# Patient Record
Sex: Male | Born: 1980 | Race: White | Hispanic: No | Smoking: Never smoker
Health system: Southern US, Community
[De-identification: ages and names within clinical notes are randomized; demographics above are authoritative.]

## PROBLEM LIST (undated history)

## (undated) DIAGNOSIS — N200 Calculus of kidney: Secondary | ICD-10-CM

## (undated) DIAGNOSIS — F329 Major depressive disorder, single episode, unspecified: Secondary | ICD-10-CM

## (undated) DIAGNOSIS — F419 Anxiety disorder, unspecified: Secondary | ICD-10-CM

## (undated) DIAGNOSIS — F32A Depression, unspecified: Secondary | ICD-10-CM

## (undated) DIAGNOSIS — K589 Irritable bowel syndrome without diarrhea: Secondary | ICD-10-CM

## (undated) DIAGNOSIS — K649 Unspecified hemorrhoids: Secondary | ICD-10-CM

## (undated) DIAGNOSIS — J189 Pneumonia, unspecified organism: Secondary | ICD-10-CM

## (undated) DIAGNOSIS — G43909 Migraine, unspecified, not intractable, without status migrainosus: Secondary | ICD-10-CM

## (undated) HISTORY — DX: Major depressive disorder, single episode, unspecified: F32.9

## (undated) HISTORY — DX: Unspecified hemorrhoids: K64.9

## (undated) HISTORY — DX: Migraine, unspecified, not intractable, without status migrainosus: G43.909

## (undated) HISTORY — DX: Calculus of kidney: N20.0

## (undated) HISTORY — DX: Anxiety disorder, unspecified: F41.9

## (undated) HISTORY — DX: Depression, unspecified: F32.A

## (undated) HISTORY — DX: Irritable bowel syndrome, unspecified: K58.9

---
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
## 2007-03-05 DIAGNOSIS — F411 Generalized anxiety disorder: Secondary | ICD-10-CM

## 2007-03-05 DIAGNOSIS — R51 Headache: Secondary | ICD-10-CM

## 2007-03-05 DIAGNOSIS — R519 Headache, unspecified: Secondary | ICD-10-CM

## 2007-03-05 DIAGNOSIS — J309 Allergic rhinitis, unspecified: Secondary | ICD-10-CM

## 2007-03-05 HISTORY — DX: Allergic rhinitis, unspecified: J30.9

## 2007-03-05 HISTORY — DX: Generalized anxiety disorder: F41.1

## 2007-03-05 HISTORY — DX: Headache, unspecified: R51.9

## 2012-03-28 HISTORY — PX: ESOPHAGOGASTRODUODENOSCOPY: SHX1529

## 2012-03-28 HISTORY — PX: COLONOSCOPY: SHX174

## 2014-01-02 ENCOUNTER — Encounter: Payer: Self-pay | Admitting: Family Medicine

## 2014-01-02 ENCOUNTER — Ambulatory Visit (INDEPENDENT_AMBULATORY_CARE_PROVIDER_SITE_OTHER): Payer: 59 | Admitting: Family Medicine

## 2014-01-02 VITALS — BP 94/65 | HR 84 | Temp 99.0°F | Resp 18 | Ht 71.5 in | Wt 233.0 lb

## 2014-01-02 DIAGNOSIS — F4323 Adjustment disorder with mixed anxiety and depressed mood: Secondary | ICD-10-CM | POA: Insufficient documentation

## 2014-01-02 HISTORY — DX: Adjustment disorder with mixed anxiety and depressed mood: F43.23

## 2014-01-02 MED ORDER — DULOXETINE HCL 60 MG PO CPEP: 60.0000 mg | ORAL_CAPSULE | Freq: Every day | ORAL | Status: DC

## 2014-01-02 MED ORDER — TRAZODONE HCL 100 MG PO TABS: 100.0000 mg | ORAL_TABLET | Freq: Every day | ORAL | Status: DC

## 2014-01-02 NOTE — Progress Notes (Signed)
Office Note 01/02/2014  CC:  Chief Complaint  Patient presents with  . Establish Care    HPI:  Keith Walton is a 33 y.o. White male who is here to establish care.  Patient's most recent primary MD: Dr. Kandice Moosupe at [facility] in [place]---unfortunately she died in MVA and practice closed.  Dr. Jeneen RinksPav at [facility] in [place] was recent psychiatric provider. Old records were not reviewed prior to or during today's visit.  Pt hospitalized in [place], July 6th and 7th, exhaustion and dehydration secondary to being overworked. He was dx'd with adjustment d/o with anxiety and depression features, has hx of panic attacks as well. Was put on cymbalta 30mg  qd and trazodone 100 mg qhs in this hospitalization.  Trial of zoloft prior to this caused bad HAs and "tremulous on the inside", so this med was stopped. Seeing Dr. Lavone OrnAnn Harrell, Licensed counselor, 1 X per week sessions--in [place].  Complains of difficulty concentrating and delayed ejaculation since getting on cymbalta.  Anxiety still pretty high but seems like depression stable--no SI or HI.  He is looking for a job.   Trazedone qhs helping well with sleep.  Past Medical History  Diagnosis Date  . Anxiety   . Migraine   . IBS (irritable bowel syndrome)     alt constip/diarrhea [facility])  . Kidney stone   . Depression     Past Surgical History  Procedure Laterality Date  . Colonoscopy  2014  . Esophagogastroduodenoscopy  2014    Family History  Problem Relation Age of Onset  . Thyroid disease Sister   . Depression Paternal Grandmother   . Schizophrenia Paternal Grandmother     History   Social History  . Marital Status: Significant Other    Spouse Name: N/A    Number of Children: N/A  . Years of Education: N/A   Occupational History  . Not on file.   Social History Main Topics  . Smoking status: Never Smoker   . Smokeless tobacco: Never Used  . Alcohol Use: 1.2 oz/week    1 Glasses  of wine, 1 Cans of beer per week     Comment: twice weekly  . Drug Use: No  . Sexual Activity: Not on file   Other Topics Concern  . Not on file   Social History Narrative   Single, has domestic partner.   No children.   Masters degree in Nutritional therapistmedia design from [employer].   Currently looking for job as of 12/2013.   No tob, 1-2 alcoholic drinks per week, no hx of prob with alc or drugs.    Outpatient Encounter Prescriptions as of 01/02/2014  Medication Sig  . Multiple Vitamin (MULTIVITAMIN) tablet Take 1 tablet by mouth daily.  . traZODone (DESYREL) 100 MG tablet Take 1 tablet (100 mg total) by mouth at bedtime.  . [DISCONTINUED] DULoxetine (CYMBALTA) 30 MG capsule Take 30 mg by mouth daily.  . [DISCONTINUED] traZODone (DESYREL) 100 MG tablet Take 100 mg by mouth at bedtime.  . DULoxetine (CYMBALTA) 60 MG capsule Take 1 capsule (60 mg total) by mouth daily.    Allergies  Allergen Reactions  . Clarithromycin   . Erythromycin   . Ibuprofen   . Levofloxacin   . Penicillins     ROS Review of Systems  Constitutional: Negative for fever, chills, appetite change and fatigue.  HENT: Negative for congestion, dental problem, ear pain and sore throat.   Eyes: Negative for discharge, redness and visual disturbance.  Respiratory: Negative for cough, chest tightness, shortness of breath and wheezing.   Cardiovascular: Negative for chest pain, palpitations and leg swelling.  Gastrointestinal: Negative for nausea, vomiting, abdominal pain, diarrhea and blood in stool.  Genitourinary: Negative for dysuria, urgency, frequency, hematuria, flank pain and difficulty urinating.  Musculoskeletal: Negative for arthralgias, back pain, joint swelling, myalgias and neck stiffness.  Skin: Negative for pallor and rash.  Neurological: Negative for dizziness, speech difficulty, weakness and headaches.  Hematological: Negative for adenopathy. Does not bruise/bleed easily.  Psychiatric/Behavioral: Positive for  dysphoric mood and decreased concentration. Negative for hallucinations, behavioral problems, confusion, sleep disturbance and agitation. The patient is nervous/anxious. The patient is not hyperactive.     PE; Blood pressure 94/65, pulse 84, temperature 99 F (37.2 C), temperature source Temporal, resp. rate 18, height 5' 11.5" (1.816 m), weight 233 lb (105.688 kg), SpO2 98.00%. Gen: Alert, well appearing.  Patient is oriented to person, place, time, and situation. AFFECT: pleasant, lucid thought and speech. ZOX:WRUE: no injection, icteris, swelling, or exudate.  EOMI, PERRLA. Mouth: lips without lesion/swelling.  Oral mucosa pink and moist. Oropharynx without erythema, exudate, or swelling.  Neck - No masses or thyromegaly or limitation in range of motion CV: RRR, no m/r/g.   LUNGS: CTA bilat, nonlabored resps, good aeration in all lung fields. EXT: no clubbing, cyanosis, or edema.  Neuro: CN 2-12 intact bilaterally, strength 5/5 in proximal and distal upper extremities and lower extremities bilaterally.  No sensory deficits.  No tremor.  No disdiadochokinesis.  No ataxia.  Upper extremity and lower extremity DTRs symmetric.  No pronator drift.  Pertinent labs:  none  ASSESSMENT AND PLAN:   New pt: obtain old recores.  Adjustment disorder with mixed anxiety and depressed mood Increase cymbalta to 60mg  qd. Hopefully concentration will improve with more therapeutic dosing.  Continue trazodone 100mg  qhs as this is helping nicely. Continue weekly visits with counselor/therapist.  Spent 30 min with pt today, with >50% of this time spent in counseling and care coordination regarding the above problems.  An After Visit Summary was printed and given to the patient.  Return in about 4 weeks (around 01/30/2014) for f/u dep/anx.

## 2014-01-02 NOTE — Assessment & Plan Note (Signed)
Increase cymbalta to 60mg  qd. Hopefully concentration will improve with more therapeutic dosing.  Continue trazodone 100mg  qhs as this is helping nicely. Continue weekly visits with counselor/therapist.

## 2014-01-02 NOTE — Progress Notes (Signed)
Pre visit review using our clinic review tool, if applicable. No additional management support is needed unless otherwise documented below in the visit note. 

## 2014-01-09 ENCOUNTER — Telehealth: Payer: Self-pay

## 2014-01-09 NOTE — Telephone Encounter (Signed)
Pt called and scheduled appointment with Dr Milinda CaveMcGowen. He would like to know if he could have a letter or Rx for massage therapy. He states his psychologist Lavone OrnAnn Harrell at [facility] recommended this for his anxiety/depression. Is this something you can do? Please advise.

## 2014-01-12 ENCOUNTER — Telehealth: Payer: Self-pay

## 2014-01-12 NOTE — Telephone Encounter (Signed)
Rx written. I'll bring it to your lab.

## 2014-01-12 NOTE — Telephone Encounter (Signed)
Spoke with pt, advised Rx ready to be picked up.

## 2014-01-12 NOTE — Telephone Encounter (Signed)
Spoke with pt, he states since he has been on Cymbalta and Trazadone it makes him sweat profusely to where he has to change clothes from sweating so bad. He states his heart rate goes up also. He wants to know if this is a side effect from the medication? Will it go away, what should he do? He has only been on Cymbalta for 2 weeks. Please advise.

## 2014-01-14 NOTE — Telephone Encounter (Signed)
May stop cymbalta and trazodone. We'll regroup/discuss a new med plan at upcoming f/u visit.-thx

## 2014-01-14 NOTE — Telephone Encounter (Signed)
Called pt, left detailed message on voicemail. 

## 2014-01-20 ENCOUNTER — Ambulatory Visit: Payer: 59 | Admitting: Family Medicine

## 2014-01-22 ENCOUNTER — Ambulatory Visit (INDEPENDENT_AMBULATORY_CARE_PROVIDER_SITE_OTHER): Payer: 59 | Admitting: Family Medicine

## 2014-01-22 ENCOUNTER — Encounter: Payer: Self-pay | Admitting: Family Medicine

## 2014-01-22 VITALS — BP 120/77 | HR 97 | Temp 98.0°F | Resp 18 | Ht 71.5 in | Wt 232.0 lb

## 2014-01-22 DIAGNOSIS — R5381 Other malaise: Secondary | ICD-10-CM

## 2014-01-22 DIAGNOSIS — F4323 Adjustment disorder with mixed anxiety and depressed mood: Secondary | ICD-10-CM

## 2014-01-22 DIAGNOSIS — J309 Allergic rhinitis, unspecified: Secondary | ICD-10-CM

## 2014-01-22 DIAGNOSIS — R5383 Other fatigue: Secondary | ICD-10-CM

## 2014-01-22 DIAGNOSIS — R002 Palpitations: Secondary | ICD-10-CM

## 2014-01-22 DIAGNOSIS — R61 Generalized hyperhidrosis: Secondary | ICD-10-CM

## 2014-01-22 DIAGNOSIS — J069 Acute upper respiratory infection, unspecified: Secondary | ICD-10-CM

## 2014-01-22 LAB — CBC WITH DIFFERENTIAL/PLATELET
BASOS ABS: 0.1 10*3/uL (ref 0.0–0.1)
BASOS PCT: 0.6 % (ref 0.0–3.0)
EOS ABS: 0.1 10*3/uL (ref 0.0–0.7)
Eosinophils Relative: 1.6 % (ref 0.0–5.0)
HCT: 46 % (ref 39.0–52.0)
HEMOGLOBIN: 15.1 g/dL (ref 13.0–17.0)
Lymphocytes Relative: 31.9 % (ref 12.0–46.0)
Lymphs Abs: 2.9 10*3/uL (ref 0.7–4.0)
MCHC: 32.9 g/dL (ref 30.0–36.0)
MCV: 92.1 fl (ref 78.0–100.0)
MONO ABS: 0.9 10*3/uL (ref 0.1–1.0)
Monocytes Relative: 9.4 % (ref 3.0–12.0)
NEUTROS ABS: 5.1 10*3/uL (ref 1.4–7.7)
NEUTROS PCT: 56.5 % (ref 43.0–77.0)
Platelets: 376 10*3/uL (ref 150.0–400.0)
RBC: 4.99 Mil/uL (ref 4.22–5.81)
RDW: 14.3 % (ref 11.5–15.5)
WBC: 9.1 10*3/uL (ref 4.0–10.5)

## 2014-01-22 LAB — COMPREHENSIVE METABOLIC PANEL
ALT: 39 U/L (ref 0–53)
AST: 28 U/L (ref 0–37)
Albumin: 3.9 g/dL (ref 3.5–5.2)
Alkaline Phosphatase: 86 U/L (ref 39–117)
BILIRUBIN TOTAL: 0.5 mg/dL (ref 0.2–1.2)
BUN: 12 mg/dL (ref 6–23)
CO2: 26 mEq/L (ref 19–32)
Calcium: 9.8 mg/dL (ref 8.4–10.5)
Chloride: 103 mEq/L (ref 96–112)
Creatinine, Ser: 0.8 mg/dL (ref 0.4–1.5)
GFR: 122.55 mL/min (ref >60.00)
GLUCOSE: 92 mg/dL (ref 70–99)
Potassium: 4.1 mEq/L (ref 3.5–5.1)
Sodium: 137 mEq/L (ref 135–145)
Total Protein: 7.3 g/dL (ref 6.0–8.3)

## 2014-01-22 LAB — TSH: TSH: 0.67 u[IU]/mL (ref 0.35–4.50)

## 2014-01-22 MED ORDER — DULOXETINE HCL 30 MG PO CPEP: ORAL_CAPSULE | ORAL | Status: DC

## 2014-01-22 MED ORDER — PAROXETINE HCL 10 MG PO TABS: ORAL_TABLET | ORAL | Status: DC

## 2014-01-22 MED ORDER — MOMETASONE FUROATE 50 MCG/ACT NA SUSP: 2.0000 | Freq: Every day | NASAL | Status: DC

## 2014-01-22 NOTE — Progress Notes (Signed)
OFFICE VISIT  01/22/2014   CC:  Chief Complaint  Patient presents with  . Follow-up    medication problem, sweats   HPI:    Patient is a 33 y.o. Caucasian male who presents for f/u anxiety/depression. He is hopeful about a new job: 3rd interview today.   Cymbalta helped anxiety a lot, but he gets profuse sweats and feels lightheaded 3-4 times a day. Sweats were continuous and he felt w/drawal anxiety when he tried stopping cymbalta cold [place].  Scratchy throat last 3-4 d, describes PND, + sneezing.  No meds taken.  No eye sx's.  No cough or wheezing. Denies feeling heartburn/reflux.  Flonase in the past didn't help much.  Claritin D makes him have heart palpitations.  Past Medical History  Diagnosis Date  . Anxiety   . Migraine   . IBS (irritable bowel syndrome)     alt constip/diarrhea [facility]  . Kidney stone   . Depression     Past Surgical History  Procedure Laterality Date  . Colonoscopy  2014  . Esophagogastroduodenoscopy  2014    Outpatient Prescriptions Prior to Visit  Medication Sig Dispense Refill  . DULoxetine (CYMBALTA) 60 MG capsule Take 1 capsule (60 mg total) by mouth daily.  30 capsule  1  . Multiple Vitamin (MULTIVITAMIN) tablet Take 1 tablet by mouth daily.      . traZODone (DESYREL) 100 MG tablet Take 1 tablet (100 mg total) by mouth at bedtime.  30 tablet  1   No facility-administered medications prior to visit.    Allergies  Allergen Reactions  . Clarithromycin   . Erythromycin   . Ibuprofen   . Levofloxacin   . Penicillins     ROS As per HPI  PE: Blood pressure 120/77, pulse 97, temperature 98 F (36.7 C), temperature source Oral, resp. rate 18, height 5' 11.5" (1.816 m), weight 232 lb (105.235 kg), SpO2 98.00%. VS: noted--normal. Gen: alert, NAD, WELL-APPEARING. HEENT: eyes without injection, drainage, or swelling.  Ears: EACs clear, TMs with normal light reflex and landmarks.  Nose: Clear rhinorrhea, with some  dried, crusty exudate adherent to mildly injected mucosa.  No purulent d/c.  Righ maxillary and frontal paranasal sinus TTP.  No facial swelling.  Throat and mouth without focal lesion.  Tonsils are large but non-erythematous and w/out exudate.  They are symmetric.  Uvula and post pharynx have no erythema, swelling, or exudate.   Neck: supple, no LAD.   LUNGS: CTA bilat, nonlabored resps.   CV: RRR, no m/r/g. EXT: no c/c/e SKIN: no rash  LABS:  None today  IMPRESSION AND PLAN:  1) Depression and anxiety: question of profuse sweating and increased HR on this med.  Ween off of cymbalta and start paxil:  cymbalta 30mg  qd x 7d, then 30mg  qod x 7 doses. Paxil 10mg  qd x 7d, then increase to 2 of the 10mg  tabs qd.  May continue trazodone 100 mg qhs for sleep. Check TSH, CBC, CMET.  2) Viral URI vs allergic rhinitis.  Infectious sinusitis doubtful. Recommended saline nasal spray, rx'd nasonex, recommended he try a daily OTC nonsedating antihistamine.  An After Visit Summary was printed and given to the patient.   FOLLOW UP:  3-4 wks

## 2014-01-22 NOTE — Patient Instructions (Signed)
Try an OTC antihistamine once a day such as generic claritin, zyrtec, or allegra. Use OTC saline nasal spray to irrigate nose 2 times per day.

## 2014-01-22 NOTE — Progress Notes (Signed)
Pre visit review using our clinic review tool, if applicable. No additional management support is needed unless otherwise documented below in the visit note. 

## 2014-01-26 DIAGNOSIS — J069 Acute upper respiratory infection, unspecified: Secondary | ICD-10-CM | POA: Insufficient documentation

## 2014-01-27 ENCOUNTER — Encounter: Payer: Self-pay | Admitting: Family Medicine

## 2014-02-02 ENCOUNTER — Telehealth: Payer: Self-pay | Admitting: Family Medicine

## 2014-02-02 MED ORDER — CITALOPRAM HYDROBROMIDE 20 MG PO TABS: 20.0000 mg | ORAL_TABLET | Freq: Every day | ORAL | Status: DC

## 2014-02-02 NOTE — Telephone Encounter (Signed)
OK. D/C paxil. Continue to ween off of cymbalta. I'll send eRx for citalopram for pt to start: follow instructions on bottle.-thx

## 2014-02-02 NOTE — Telephone Encounter (Signed)
Patient LMOM stating that paxil is giving him horrible body aches, he states they are so bad it's hard for him to walk around and do normal daily duties.  Please advise.

## 2014-02-02 NOTE — Telephone Encounter (Signed)
Left message for pt to call back  °

## 2014-02-03 NOTE — Telephone Encounter (Signed)
Spoke with pt, advised message from Dr. Milinda CaveMcGowen. Pt understood. Pt states he doesn't want to try another serotonin uptake inhibitor. He just wants to stay on Trazadone. He is scared of the effects from these medications. I advised pt to keep Dr Milinda CaveMcGowen updated about this process. Pt agreed.

## 2014-02-03 NOTE — Telephone Encounter (Signed)
Noted  

## 2014-02-13 ENCOUNTER — Encounter: Payer: Self-pay | Admitting: Family Medicine

## 2014-02-13 ENCOUNTER — Ambulatory Visit (INDEPENDENT_AMBULATORY_CARE_PROVIDER_SITE_OTHER): Payer: 59 | Admitting: Family Medicine

## 2014-02-13 VITALS — BP 111/76 | HR 86 | Temp 99.0°F | Resp 18 | Ht 71.5 in | Wt 226.0 lb

## 2014-02-13 DIAGNOSIS — K589 Irritable bowel syndrome without diarrhea: Secondary | ICD-10-CM

## 2014-02-13 DIAGNOSIS — F411 Generalized anxiety disorder: Secondary | ICD-10-CM

## 2014-02-13 DIAGNOSIS — R634 Abnormal weight loss: Secondary | ICD-10-CM

## 2014-02-13 NOTE — Progress Notes (Signed)
OFFICE NOTE  02/13/2014  CC:  Chief Complaint  Patient presents with  . Follow-up   HPI: Patient is a 33 y.o. Caucasian male who is here for 3 wk f/u anxiety.  Last visit I started him on paxil but this gave him body aches so he d/c'd it.  He chose not to take the citalopram I suggested after this.  He is afraid of the effects of these meds now, wanted to stick with just trazodone.  Sleeping fine on trazodone. Having much less anxiety and depression than when I initially started seeing him.  Says he has an afternoon dip most days but no extreme or prolonged sx's.  Still seeing counselor and he has found her suggestion of adult coloring book use as a good therapy for his anxiety.  Says he weighed 265 lbs in June this year, now weighs 226---40 lb drop in 2-3 months, NOT purposeful. Struggles with IBS--alternates between loose BMs after meals frequently and then periods of constipation.  Denies BRBPR or melena.    Pertinent PMH:  Past medical, surgical, social, and family history reviewed and no changes are noted since last office visit.  MEDS: Not on celexa listed below Outpatient Prescriptions Prior to Visit  Medication Sig Dispense Refill  . Multiple Vitamin (MULTIVITAMIN) tablet Take 1 tablet by mouth daily.      . traZODone (DESYREL) 100 MG tablet Take 1 tablet (100 mg total) by mouth at bedtime.  30 tablet  1  . citalopram (CELEXA) 20 MG tablet Take 1 tablet (20 mg total) by mouth daily.  30 tablet  1  . mometasone (NASONEX) 50 MCG/ACT nasal spray Place 2 sprays into the nose daily.  17 g  12   No facility-administered medications prior to visit.    PE: Blood pressure 111/76, pulse 86, temperature 99 F (37.2 C), temperature source Temporal, resp. rate 18, height 5' 11.5" (1.816 m), weight 226 lb (102.513 kg), SpO2 99.00%. Gen: Alert, well appearing.  Patient is oriented to person, place, time, and situation. Oropharynx: symmetrically large tonsils--chronic.  No erythema or  exudate. Neck - No masses or thyromegaly or limitation in range of motion CV: RRR, no m/r/g.   LUNGS: CTA bilat, nonlabored resps, good aeration in all lung fields. ABD: soft/NT/ND EXT: no clubbing, cyanosis, or edema.  SKIN: no jaundice or rash  LAB: none today Lab Results  Component Value Date   WBC 9.1 01/22/2014   HGB 15.1 01/22/2014   HCT 46.0 01/22/2014   MCV 92.1 01/22/2014   PLT 376.0 01/22/2014     Chemistry      Component Value Date/Time   NA 137 01/22/2014 0936   K 4.1 01/22/2014 0936   CL 103 01/22/2014 0936   CO2 26 01/22/2014 0936   BUN 12 01/22/2014 0936   CREATININE 0.8 01/22/2014 0936      Component Value Date/Time   CALCIUM 9.8 01/22/2014 0936   ALKPHOS 86 01/22/2014 0936   AST 28 01/22/2014 0936   ALT 39 01/22/2014 0936   BILITOT 0.5 01/22/2014 0936     Lab Results  Component Value Date   TSH 0.67 01/22/2014      IMPRESSION AND PLAN:  1) Anxiety/depression/insomnia:  Holding with only trazodone hs and counseling for now.  Will consider trial of another antidepressant in future if he worsens, but will do an SNRI instead of SSRI since he has not tolerated 3 of these.  2) Unintentional wt loss: recent basic labs normal. Exam unremarkable.  PO  intake pretty much normal/unchanged.   Will check hemoccults today and have him get established with a local GI MD for his IBS and possibly further eval for his wt loss (?malabsorptioin.  However, as in [facility] section, he has had SB bx normal in the past).Marland Kitchen  An After Visit Summary was printed and given to the patient. He declined flu vaccine today.  FOLLOW UP: 1 mo for f/u wt loss.

## 2014-02-13 NOTE — Progress Notes (Signed)
Pre visit review using our clinic review tool, if applicable. No additional management support is needed unless otherwise documented below in the visit note. 

## 2014-02-16 ENCOUNTER — Telehealth: Payer: Self-pay | Admitting: Family Medicine

## 2014-02-16 MED ORDER — TRAZODONE HCL 100 MG PO TABS
100.0000 mg | ORAL_TABLET | Freq: Every day | ORAL | Status: DC
Start: 2014-02-16 — End: 2014-05-13

## 2014-02-16 NOTE — Telephone Encounter (Signed)
Please advise if we can even refill this medication before it's due.

## 2014-02-16 NOTE — Telephone Encounter (Signed)
Yes, ok to rf this med early.-thx

## 2014-02-16 NOTE — Telephone Encounter (Signed)
Sent in new E-RX. Left detailed message on pt's phone of early refill sent into pharmacy.  Okay per DPR.

## 2014-02-16 NOTE — Telephone Encounter (Signed)
Patient knocked his bottle in to the toilet last night. Please send in Rx today, patient does not have medication to take tonight.

## 2014-03-16 ENCOUNTER — Encounter: Payer: Self-pay | Admitting: Family Medicine

## 2014-03-17 ENCOUNTER — Ambulatory Visit: Payer: 59 | Admitting: Family Medicine

## 2014-03-23 ENCOUNTER — Ambulatory Visit (INDEPENDENT_AMBULATORY_CARE_PROVIDER_SITE_OTHER): Payer: 59 | Admitting: Family Medicine

## 2014-03-23 ENCOUNTER — Encounter: Payer: Self-pay | Admitting: Family Medicine

## 2014-03-23 VITALS — BP 107/75 | HR 84 | Temp 98.4°F | Resp 18 | Ht 71.5 in | Wt 224.0 lb

## 2014-03-23 DIAGNOSIS — R634 Abnormal weight loss: Secondary | ICD-10-CM | POA: Insufficient documentation

## 2014-03-23 DIAGNOSIS — F4323 Adjustment disorder with mixed anxiety and depressed mood: Secondary | ICD-10-CM

## 2014-03-23 NOTE — Patient Instructions (Signed)
CPT code for Therapeutic massage is [phone]

## 2014-03-23 NOTE — Progress Notes (Signed)
Pre visit review using our clinic review tool, if applicable. No additional management support is needed unless otherwise documented below in the visit note. 

## 2014-03-23 NOTE — Progress Notes (Signed)
OFFICE NOTE  03/23/2014  CC:  Chief Complaint  Patient presents with  . Follow-up   HPI: Patient is a 33 y.o. Caucasian male who is here for 5 week f/u anxiety/depression and unintentional weight loss. Anx/dep a bit better, [place]. Does NOT desire any further psych meds.  He feels like he is coping pretty well with things right now.  Trazodone helping hs for sleep.  Massage therapy helped A LOT: he needs CPT code for insurance to cover. His appetite is fine, energy level fine, no diarrhea or abd pain.  He doesn't want to pursue any further blood testing for his problem of unintentional wt loss b/c he thinks since it has slowed down quite a bit over the last 2 mo (9 lb down in last 2.5 mo compared to 40 lbs down in the prior 2-3 mo) it is ok to watch and wait and is likely related to his "nervous breakdown".  Says GI has not contacted him about referral that I ordered last visit.   Pertinent PMH:  Past medical, surgical, social, and family history reviewed and no changes are noted since last office visit.  MEDS:  Trazodone 100 mg qhs for sleep, MVI qd  PE: Blood pressure 107/75, pulse 84, temperature 98.4 F (36.9 C), temperature source Temporal, resp. rate 18, height 5' 11.5" (1.816 m), weight 224 lb (101.606 kg), SpO2 97.00%. Gen: Alert, well appearing.  Patient is oriented to person, place, time, and situation. No further exam today.  IMPRESSION AND PLAN:  1) Adjustment d/o with anxiety and depression features: improving. Intolerant of several antidepressant trials.  Continue trazodone hs for sleep, massage therapy for stress reduction.  2) Unintentional Wt loss; watchful waiting approach.  No sign of organic dz on minimal w/u with blood work and CPE.  Past GI eval has shown normal colonoscopy and EGD.   GI has been trying to contact him, has left 3 messages on his VM. We'll make appt while he's here today.  An After Visit Summary was printed  and given to the patient.  FOLLOW UP: 4 mo

## 2014-04-02 ENCOUNTER — Encounter: Payer: Self-pay | Admitting: Family Medicine

## 2014-04-02 ENCOUNTER — Encounter: Payer: Self-pay | Admitting: Internal Medicine

## 2014-05-12 ENCOUNTER — Other Ambulatory Visit: Payer: Self-pay | Admitting: Family Medicine

## 2014-05-12 NOTE — Telephone Encounter (Signed)
RF request for trazadone.  Last OV was 10.5.15.   Last RX was 8.31.15 x 1 rf.  Please advise.

## 2014-05-13 MED ORDER — TRAZODONE HCL 100 MG PO TABS: 100.0000 mg | ORAL_TABLET | Freq: Every day | ORAL | Status: DC

## 2014-05-13 NOTE — Telephone Encounter (Signed)
Okay to fill per Dr. Milinda CaveMcGowen.  Rx sent as previously prescribed x 1 rf.

## 2014-05-13 NOTE — Telephone Encounter (Signed)
Agree 

## 2014-06-04 ENCOUNTER — Ambulatory Visit: Payer: 59 | Admitting: Internal Medicine

## 2014-07-27 ENCOUNTER — Ambulatory Visit: Payer: 59 | Admitting: Family Medicine

## 2014-09-02 DIAGNOSIS — K582 Mixed irritable bowel syndrome: Secondary | ICD-10-CM | POA: Insufficient documentation

## 2014-11-25 ENCOUNTER — Ambulatory Visit: Payer: Self-pay | Admitting: Family Medicine

## 2014-11-26 ENCOUNTER — Ambulatory Visit: Payer: Self-pay | Admitting: Family Medicine

## 2015-12-09 ENCOUNTER — Encounter (HOSPITAL_BASED_OUTPATIENT_CLINIC_OR_DEPARTMENT_OTHER): Payer: Self-pay

## 2015-12-09 ENCOUNTER — Emergency Department (HOSPITAL_BASED_OUTPATIENT_CLINIC_OR_DEPARTMENT_OTHER)
Admission: EM | Admit: 2015-12-09 | Discharge: 2015-12-09 | Disposition: A | Discharge status: Home / Self Care | Payer: BLUE CROSS/BLUE SHIELD | Attending: Emergency Medicine | Admitting: Emergency Medicine

## 2015-12-09 ENCOUNTER — Emergency Department (HOSPITAL_BASED_OUTPATIENT_CLINIC_OR_DEPARTMENT_OTHER): Payer: BLUE CROSS/BLUE SHIELD

## 2015-12-09 DIAGNOSIS — F329 Major depressive disorder, single episode, unspecified: Secondary | ICD-10-CM | POA: Insufficient documentation

## 2015-12-09 DIAGNOSIS — N200 Calculus of kidney: Secondary | ICD-10-CM | POA: Diagnosis not present

## 2015-12-09 DIAGNOSIS — R109 Unspecified abdominal pain: Secondary | ICD-10-CM | POA: Diagnosis present

## 2015-12-09 LAB — URINALYSIS, ROUTINE W REFLEX MICROSCOPIC
BILIRUBIN URINE: NEGATIVE
GLUCOSE, UA: NEGATIVE mg/dL
KETONES UR: 15 mg/dL — AB
LEUKOCYTES UA: NEGATIVE
Nitrite: NEGATIVE
PROTEIN: NEGATIVE mg/dL
Specific Gravity, Urine: 1.02 (ref 1.005–1.030)
pH: 5.5 (ref 5.0–8.0)

## 2015-12-09 LAB — BASIC METABOLIC PANEL
Anion gap: 9 (ref 5–15)
BUN: 21 mg/dL — ABNORMAL HIGH (ref 6–20)
CALCIUM: 9.2 mg/dL (ref 8.9–10.3)
CO2: 24 mmol/L (ref 22–32)
CREATININE: 1.18 mg/dL (ref 0.61–1.24)
Chloride: 104 mmol/L (ref 101–111)
GFR calc non Af Amer: >60 mL/min (ref >60)
GLUCOSE: 116 mg/dL — AB (ref 65–99)
Potassium: 4 mmol/L (ref 3.5–5.1)
Sodium: 137 mmol/L (ref 135–145)

## 2015-12-09 LAB — URINE MICROSCOPIC-ADD ON: Bacteria, UA: NONE SEEN

## 2015-12-09 MED ORDER — KETOROLAC TROMETHAMINE 30 MG/ML IJ SOLN
30.0000 mg | Freq: Once | INTRAMUSCULAR | Status: AC
  Administered 2015-12-09: 30 mg via INTRAVENOUS
  Filled 2015-12-09: qty 1

## 2015-12-09 MED ORDER — TAMSULOSIN HCL 0.4 MG PO CAPS
0.4000 mg | ORAL_CAPSULE | Freq: Every day | ORAL | Status: DC
  Administered 2015-12-09: 0.4 mg via ORAL
  Filled 2015-12-09: qty 1

## 2015-12-09 MED ORDER — ONDANSETRON 8 MG PO TBDP: ORAL_TABLET | ORAL | Status: DC

## 2015-12-09 MED ORDER — OXYCODONE-ACETAMINOPHEN 5-325 MG PO TABS: 1.0000 | ORAL_TABLET | Freq: Four times a day (QID) | ORAL | Status: DC | PRN

## 2015-12-09 MED ORDER — MAGNESIUM SULFATE 50 % IJ SOLN
INTRAMUSCULAR | Status: AC
  Administered 2015-12-09: 04:00:00
  Filled 2015-12-09: qty 2

## 2015-12-09 MED ORDER — SODIUM CHLORIDE 0.9 % IV BOLUS (SEPSIS)
500.0000 mL | Freq: Once | INTRAVENOUS | Status: AC
  Administered 2015-12-09: 500 mL via INTRAVENOUS

## 2015-12-09 MED ORDER — DICLOFENAC SODIUM ER 100 MG PO TB24: 100.0000 mg | ORAL_TABLET | Freq: Every day | ORAL | Status: DC

## 2015-12-09 MED ORDER — MAGNESIUM SULFATE IN D5W 1-5 GM/100ML-% IV SOLN
1.0000 g | Freq: Once | INTRAVENOUS | Status: DC
  Filled 2015-12-09: qty 100

## 2015-12-09 MED ORDER — ONDANSETRON HCL 4 MG/2ML IJ SOLN
4.0000 mg | Freq: Once | INTRAMUSCULAR | Status: AC
  Administered 2015-12-09: 4 mg via INTRAVENOUS
  Filled 2015-12-09: qty 2

## 2015-12-09 MED ORDER — MORPHINE SULFATE (PF) 4 MG/ML IV SOLN
4.0000 mg | Freq: Once | INTRAVENOUS | Status: AC
  Administered 2015-12-09: 4 mg via INTRAVENOUS
  Filled 2015-12-09: qty 1

## 2015-12-09 MED ORDER — TAMSULOSIN HCL 0.4 MG PO CAPS: 0.4000 mg | ORAL_CAPSULE | Freq: Every day | ORAL | Status: DC

## 2015-12-09 NOTE — Discharge Instructions (Signed)
Dietary Guidelines to Help Prevent Kidney Stones Your risk of kidney stones can be decreased by adjusting the foods you eat. The most important thing you can do is drink enough fluid. You should drink enough fluid to keep your urine clear or pale yellow. The following guidelines provide specific information for the type of kidney stone you have had. GUIDELINES ACCORDING TO TYPE OF KIDNEY STONE Calcium Oxalate Kidney Stones  Reduce the amount of salt you eat. Foods that have a lot of salt cause your body to release excess calcium into your urine. The excess calcium can combine with a substance called oxalate to form kidney stones.  Reduce the amount of animal protein you eat if the amount you eat is excessive. Animal protein causes your body to release excess calcium into your urine. Ask your dietitian how much protein from animal sources you should be eating.  Avoid foods that are high in oxalates. If you take vitamins, they should have less than 500 mg of vitamin C. Your body turns vitamin C into oxalates. You do not need to avoid fruits and vegetables high in vitamin C. Calcium Phosphate Kidney Stones  Reduce the amount of salt you eat to help prevent the release of excess calcium into your urine.  Reduce the amount of animal protein you eat if the amount you eat is excessive. Animal protein causes your body to release excess calcium into your urine. Ask your dietitian how much protein from animal sources you should be eating.  Get enough calcium from food or take a calcium supplement (ask your dietitian for recommendations). Food sources of calcium that do not increase your risk of kidney stones include:  Broccoli.  Dairy products, such as cheese and yogurt.  Pudding. Uric Acid Kidney Stones  Do not have more than 6 oz of animal protein per day. FOOD SOURCES Animal Protein Sources  Meat (all types).  Poultry.  Eggs.  Fish, seafood. Foods High in Salt  Salt seasonings.  Soy  sauce.  Teriyaki sauce.  Cured and processed meats.  Salted crackers and snack foods.  Fast food.  Canned soups and most canned foods. Foods High in Oxalates  Grains:  Amaranth.  Barley.  Grits.  Wheat germ.  Bran.  Buckwheat flour.  All bran cereals.  Pretzels.  Whole wheat bread.  Vegetables:  Beans (wax).  Beets and beet greens.  Collard greens.  Eggplant.  Escarole.  Leeks.  Okra.  Parsley.  Rutabagas.  Spinach.  Swiss chard.  Tomato paste.  Fried potatoes.  Sweet potatoes.  Fruits:  Red currants.  Figs.  Kiwi.  Rhubarb.  Meat and Other Protein Sources:  Beans (dried).  Soy burgers and other soybean products.  Miso.  Nuts (peanuts, almonds, pecans, cashews, hazelnuts).  Nut butters.  Sesame seeds and tahini (paste made of sesame seeds).  Poppy seeds.  Beverages:  Chocolate drink mixes.  Soy milk.  Instant iced tea.  Juices made from high-oxalate fruits or vegetables.  Other:  Carob.  Chocolate.  Fruitcake.  Marmalades.   This information is not intended to replace advice given to you by your health care provider. Make sure you discuss any questions you have with your health care provider.   Document Released: 09/30/2010 Document Revised: 06/10/2013 Document Reviewed: 05/02/2013 Elsevier Interactive Patient Education 2016 [employer]  

## 2015-12-09 NOTE — ED Provider Notes (Signed)
CSN: [id number]     Arrival date & time 12/09/15  0201 History   First MD Initiated Contact with Patient 12/09/15 0206     Chief Complaint  Patient presents with  . Flank Pain     (Consider location/radiation/quality/duration/timing/severity/associated sxs/prior Treatment) Patient is a 35 y.o. male presenting with flank pain. The history is provided by the patient.  Flank Pain This is a recurrent problem. The current episode started 6 to 12 hours ago. The problem occurs constantly. The problem has not changed since onset.Pertinent negatives include no chest pain, no abdominal pain, no headaches and no shortness of breath. Nothing aggravates the symptoms. He has tried ASA for the symptoms. The treatment provided no relief.    Past Medical History  Diagnosis Date  . Anxiety and depression   . Migraine syndrome   . IBS (irritable bowel syndrome)     alt constip/diarrhea [facility])  . Kidney stone   . Hemorrhoids     int and ext: hx of BRBPR   Past Surgical History  Procedure Laterality Date  . Colonoscopy  03/28/2012    "Small int hemorrhoids".  Otherwise normal.  Repeat at age 24 was recommended.  . Esophagogastroduodenoscopy  03/28/2012    Duodenal bx: normal small bowel mucosa.  Gastric bx: minimal superficial chronic inflammation--H pylori neg.   Family History  Problem Relation Age of Onset  . Thyroid disease Sister   . Depression Paternal Grandmother   . Schizophrenia Paternal Grandmother    Social History  Substance Use Topics  . Smoking status: Never Smoker   . Smokeless tobacco: Never Used  . Alcohol Use: 1.2 oz/week    1 Glasses of wine, 1 Cans of beer per week     Comment: twice weekly    Review of Systems  Respiratory: Negative for shortness of breath.   Cardiovascular: Negative for chest pain.  Gastrointestinal: Negative for abdominal pain.  Genitourinary: Positive for flank pain. Negative for dysuria, frequency and hematuria.   Neurological: Negative for headaches.  All other systems reviewed and are negative.     Allergies  Clarithromycin; Erythromycin; Ibuprofen; Levofloxacin; and Penicillins  Home Medications   Prior to Admission medications   Medication Sig Start Date End Date Taking? Authorizing Provider  citalopram (CELEXA) 20 MG tablet Take 1 tablet (20 mg total) by mouth daily. 02/02/14   Jeoffrey Massed, MD  Diclofenac Sodium CR (VOLTAREN-XR) 100 MG 24 hr tablet Take 1 tablet (100 mg total) by mouth daily. 12/09/15   Steed Kanaan, MD  mometasone (NASONEX) 50 MCG/ACT nasal spray Place 2 sprays into the nose daily. 01/22/14   Jeoffrey Massed, MD  Multiple Vitamin (MULTIVITAMIN) tablet Take 1 tablet by mouth daily.    Historical Provider, MD  ondansetron (ZOFRAN ODT) 8 MG disintegrating tablet  ODT q8 hours prn nausea 12/09/15   Nghia Mcentee, MD  oxyCODONE-acetaminophen (PERCOCET) 5-325 MG tablet Take 1 tablet by mouth every 6 (six) hours as needed. 12/09/15   Ericah Scotto, MD  tamsulosin (FLOMAX) 0.4 MG CAPS capsule Take 1 capsule (0.4 mg total) by mouth daily. 12/09/15   Jamarion Jumonville, MD  traZODone (DESYREL) 100 MG tablet Take 1 tablet (100 mg total) by mouth at bedtime. 05/13/14   Jeoffrey Massed, MD   BP 116/74 mmHg  Pulse 84  Temp(Src) 98.8 F (37.1 C) (Oral)  Resp 20  Ht  (1.88 m)  Wt 245 lb (111.131 kg)  BMI 31.44 kg/m2  SpO2 97% Physical Exam  Constitutional: He is oriented to person, place, and time. He appears well-developed and well-nourished. No distress.  HENT:  Head: Normocephalic and atraumatic.  Mouth/Throat: Oropharynx is clear and moist.  Eyes: Conjunctivae are normal. Pupils are equal, round, and reactive to light.  Neck: Normal range of motion. Neck supple.  Cardiovascular: Normal rate, regular rhythm and intact distal pulses.   Pulmonary/Chest: Effort normal and breath sounds normal. No respiratory distress. He has no wheezes. He has no rales.  Abdominal: Soft.  Bowel sounds are normal. There is no tenderness. There is no rebound and no guarding.  Musculoskeletal: Normal range of motion.  Neurological: He is alert and oriented to person, place, and time.  Skin: Skin is warm and dry.  Psychiatric: He has a normal mood and affect.    ED Course  Procedures (including critical care time) Labs Review Labs Reviewed  URINALYSIS, ROUTINE W REFLEX MICROSCOPIC (NOT AT [facility]) - Abnormal; Notable for the following:    Hgb urine dipstick SMALL (*)    Ketones, ur 15 (*)    All other components within normal limits  BASIC METABOLIC PANEL - Abnormal; Notable for the following:    Glucose, Bld 116 (*)    BUN 21 (*)    All other components within normal limits  URINE MICROSCOPIC-ADD ON - Abnormal; Notable for the following:    Squamous Epithelial / LPF 0-5 (*)    All other components within normal limits    Imaging Review Ct Renal Stone Study  12/09/2015  CLINICAL DATA:  Acute onset of right flank pain and hematuria. Initial encounter. EXAM: CT ABDOMEN AND PELVIS WITHOUT CONTRAST TECHNIQUE: Multidetector CT imaging of the abdomen and pelvis was performed following the standard protocol without IV contrast. COMPARISON:  None. FINDINGS: The visualized lung bases are clear. The liver and spleen are unremarkable in appearance. The gallbladder is within normal limits. The pancreas and adrenal glands are unremarkable. Mild-to-moderate right-sided hydronephrosis is noted, with a large obstructing 7 x 6 mm stone at the proximal right ureter, 4-5 cm below the right renal pelvis. Mild right-sided perinephric stranding is noted. Scattered nonobstructing left renal stones are seen, measuring up to 4 mm in size. No free fluid is identified. The small bowel is unremarkable in appearance. The stomach is within normal limits. No acute vascular abnormalities are seen. The appendix is grossly unremarkable in appearance, though incompletely assessed. There is no evidence for  appendicitis. The colon is decompressed and unremarkable in appearance. The bladder is decompressed and not well assessed. The prostate remains normal in size, with minimal calcification. No inguinal lymphadenopathy is seen. No acute osseous abnormalities are identified. IMPRESSION: 1. Mild-to-moderate right-sided hydronephrosis, with a large obstructing 7 x 6 mm stone at the proximal right ureter, 4-5 cm below the right renal pelvis. 2. Scattered nonobstructing left renal stones, measuring up to 4 mm in size. Electronically Signed   By: Roanna RaiderJeffery  Chang M.D.   On: 12/09/2015 03:34   I have personally reviewed and evaluated these images and lab results as part of my medical decision-making.   EKG Interpretation None      MDM   Final diagnoses:  Kidney stone    Filed Vitals:   12/09/15 0300 12/09/15 0400  BP: 128/78 116/74  Pulse: 64 84  Temp:  98.8 F (37.1 C)  Resp:      Medications  magnesium sulfate IVPB 1 g 100 mL (not administered)  tamsulosin (FLOMAX) capsule 0.4 mg (0.4 mg Oral Given 12/09/15 0354)  ketorolac (  TORADOL) 30 MG/ML injection 30 mg (30 mg Intravenous Given 12/09/15 0253)  sodium chloride 0.9 % bolus 500 mL (0 mLs Intravenous Stopped 12/09/15 0355)  ondansetron (ZOFRAN) injection 4 mg (4 mg Intravenous Given 12/09/15 0252)  morphine 4 MG/ML injection 4 mg (4 mg Intravenous Given 12/09/15 0353)  magnesium sulfate 50 % (IV Push/IM) injection (  Given 12/09/15 0354)    Markedly improved post medication.  Follow up with urology within 7 days.  Strain all urine.  Strict return precautions given      Adel Neyer, MD 12/09/15 0500

## 2015-12-09 NOTE — ED Notes (Signed)
Pt c/o sudden onset of R lower flank pain, with associated nausea. Pt states pain became worse after urinating. Pt states, "it feels like previous kidney stone."

## 2016-06-04 ENCOUNTER — Encounter (HOSPITAL_BASED_OUTPATIENT_CLINIC_OR_DEPARTMENT_OTHER): Payer: Self-pay | Admitting: Emergency Medicine

## 2016-06-04 ENCOUNTER — Emergency Department (HOSPITAL_BASED_OUTPATIENT_CLINIC_OR_DEPARTMENT_OTHER)
Admission: EM | Admit: 2016-06-04 | Discharge: 2016-06-05 | Disposition: A | Discharge status: Home / Self Care | Payer: BLUE CROSS/BLUE SHIELD | Attending: Emergency Medicine | Admitting: Emergency Medicine

## 2016-06-04 ENCOUNTER — Emergency Department (HOSPITAL_BASED_OUTPATIENT_CLINIC_OR_DEPARTMENT_OTHER): Payer: BLUE CROSS/BLUE SHIELD

## 2016-06-04 DIAGNOSIS — N23 Unspecified renal colic: Secondary | ICD-10-CM | POA: Diagnosis not present

## 2016-06-04 DIAGNOSIS — R109 Unspecified abdominal pain: Secondary | ICD-10-CM | POA: Diagnosis present

## 2016-06-04 LAB — URINALYSIS, ROUTINE W REFLEX MICROSCOPIC
BILIRUBIN URINE: NEGATIVE
GLUCOSE, UA: NEGATIVE mg/dL
Ketones, ur: 15 mg/dL — AB
Leukocytes, UA: NEGATIVE
Nitrite: NEGATIVE
PROTEIN: NEGATIVE mg/dL
Specific Gravity, Urine: 1.027 (ref 1.005–1.030)
pH: 5.5 (ref 5.0–8.0)

## 2016-06-04 LAB — BASIC METABOLIC PANEL
Anion gap: 9 (ref 5–15)
BUN: 18 mg/dL (ref 6–20)
CHLORIDE: 105 mmol/L (ref 101–111)
CO2: 25 mmol/L (ref 22–32)
CREATININE: 0.95 mg/dL (ref 0.61–1.24)
Calcium: 9.2 mg/dL (ref 8.9–10.3)
GFR calc Af Amer: >60 mL/min (ref >60)
GFR calc non Af Amer: >60 mL/min (ref >60)
GLUCOSE: 119 mg/dL — AB (ref 65–99)
Potassium: 4 mmol/L (ref 3.5–5.1)
SODIUM: 139 mmol/L (ref 135–145)

## 2016-06-04 LAB — CBC WITH DIFFERENTIAL/PLATELET
Basophils Absolute: 0.1 10*3/uL (ref 0.0–0.1)
Basophils Relative: 0 %
EOS ABS: 0 10*3/uL (ref 0.0–0.7)
EOS PCT: 0 %
HCT: 46.3 % (ref 39.0–52.0)
Hemoglobin: 15.5 g/dL (ref 13.0–17.0)
LYMPHS ABS: 2.3 10*3/uL (ref 0.7–4.0)
Lymphocytes Relative: 13 %
MCH: 30.6 pg (ref 26.0–34.0)
MCHC: 33.5 g/dL (ref 30.0–36.0)
MCV: 91.3 fL (ref 78.0–100.0)
MONO ABS: 1.1 10*3/uL — AB (ref 0.1–1.0)
MONOS PCT: 7 %
Neutro Abs: 13.5 10*3/uL — ABNORMAL HIGH (ref 1.7–7.7)
Neutrophils Relative %: 80 %
PLATELETS: 422 10*3/uL — AB (ref 150–400)
RBC: 5.07 MIL/uL (ref 4.22–5.81)
RDW: 13.6 % (ref 11.5–15.5)
WBC: 17 10*3/uL — AB (ref 4.0–10.5)

## 2016-06-04 LAB — URINALYSIS, MICROSCOPIC (REFLEX)

## 2016-06-04 MED ORDER — MORPHINE SULFATE (PF) 4 MG/ML IV SOLN
4.0000 mg | Freq: Once | INTRAVENOUS | Status: AC
  Administered 2016-06-04: 4 mg via INTRAVENOUS
  Filled 2016-06-04: qty 1

## 2016-06-04 MED ORDER — ONDANSETRON HCL 4 MG/2ML IJ SOLN
4.0000 mg | Freq: Once | INTRAMUSCULAR | Status: AC
  Administered 2016-06-04: 4 mg via INTRAVENOUS
  Filled 2016-06-04: qty 2

## 2016-06-04 MED ORDER — SODIUM CHLORIDE 0.9 % IV BOLUS (SEPSIS)
1000.0000 mL | Freq: Once | INTRAVENOUS | Status: AC
  Administered 2016-06-04: 1000 mL via INTRAVENOUS

## 2016-06-04 MED ORDER — HYDROMORPHONE HCL 1 MG/ML IJ SOLN
1.0000 mg | Freq: Once | INTRAMUSCULAR | Status: AC
  Administered 2016-06-04: 1 mg via INTRAVENOUS
  Filled 2016-06-04: qty 1

## 2016-06-04 MED ORDER — ONDANSETRON 8 MG PO TBDP: ORAL_TABLET | ORAL | 0 refills | Status: DC

## 2016-06-04 MED ORDER — OXYCODONE-ACETAMINOPHEN 5-325 MG PO TABS: 1.0000 | ORAL_TABLET | Freq: Four times a day (QID) | ORAL | 0 refills | Status: DC | PRN

## 2016-06-04 MED ORDER — SODIUM CHLORIDE 0.9 % IV BOLUS (SEPSIS)
1000.0000 mL | Freq: Once | INTRAVENOUS | Status: AC
Start: 2016-06-04 — End: 2016-06-04
  Administered 2016-06-04: 1000 mL via INTRAVENOUS

## 2016-06-04 MED ORDER — TAMSULOSIN HCL 0.4 MG PO CAPS: 0.4000 mg | ORAL_CAPSULE | Freq: Every day | ORAL | 0 refills | Status: DC

## 2016-06-04 NOTE — ED Provider Notes (Signed)
[facility] Provider Note   CSN: [id number] Arrival date & time: 06/04/16  2008  By signing my name below, I, Arianna Nassar and Talbert Nanaul Grant, attest that this documentation has been prepared under the direction and in the presence of Jacalyn LefevreJulie Katiejo Gilroy, MD.  Electronically Signed: Octavia HeirArianna Nassar, ED Scribe. 06/04/16. 8:30 PM.   History   Chief Complaint Chief Complaint  Patient presents with  . Flank Pain    HPI HPI Comments: Sherrlyn HockMatthew D Agcaoili is a 35 y.o. male who presents to the Emergency Department complaining of acute onset, constant flank pain since 1730 this evening. Pt has a hx of kidney stones and he reports that this pain feels just like in the past. Pt reports that he normally has passed the stones. He says that he has taken Percocet-Acetametaphin mix without relief. He has no other complaints.  The history is provided by the patient. No language interpreter was used.    Past Medical History:  Diagnosis Date  . Anxiety and depression   . Hemorrhoids    int and ext: hx of BRBPR  . IBS (irritable bowel syndrome)    alt constip/diarrhea [facility]  . Kidney stone   . Migraine syndrome     Patient Active Problem List   Diagnosis Date Noted  . Unintentional weight loss 03/23/2014  . Adjustment disorder with mixed anxiety and depressed mood 01/02/2014  . ANXIETY 03/05/2007  . ALLERGIC RHINITIS 03/05/2007  . HEADACHE 03/05/2007    Past Surgical History:  Procedure Laterality Date  . COLONOSCOPY  03/28/2012   "Small int hemorrhoids".  Otherwise normal.  Repeat at age 35 was recommended.  . ESOPHAGOGASTRODUODENOSCOPY  03/28/2012   Duodenal bx: normal small bowel mucosa.  Gastric bx: minimal superficial chronic inflammation--H pylori neg.       Home Medications    Prior to Admission medications   Medication Sig Start Date End Date Taking? Authorizing Provider  citalopram (CELEXA) 20 MG tablet Take 1 tablet (20 mg total) by mouth daily.  02/02/14   Jeoffrey MassedPhilip H McGowen, MD  Diclofenac Sodium CR (VOLTAREN-XR) 100 MG 24 hr tablet Take 1 tablet (100 mg total) by mouth daily. 12/09/15   April Palumbo, MD  mometasone (NASONEX) 50 MCG/ACT nasal spray Place 2 sprays into the nose daily. 01/22/14   Jeoffrey MassedPhilip H McGowen, MD  Multiple Vitamin (MULTIVITAMIN) tablet Take 1 tablet by mouth daily.    Historical Provider, MD  ondansetron (ZOFRAN ODT) 8 MG disintegrating tablet 8mg  ODT q8 hours prn nausea 06/04/16   Jacalyn LefevreJulie Ronnell Makarewicz, MD  oxyCODONE-acetaminophen (PERCOCET) 5-325 MG tablet Take 1 tablet by mouth every 6 (six) hours as needed. 06/04/16   Jacalyn LefevreJulie Lonita Debes, MD  tamsulosin (FLOMAX) 0.4 MG CAPS capsule Take 1 capsule (0.4 mg total) by mouth daily. 06/04/16   Jacalyn LefevreJulie Kaytee Taliercio, MD  traZODone (DESYREL) 100 MG tablet Take 1 tablet (100 mg total) by mouth at bedtime. 05/13/14   Jeoffrey MassedPhilip H McGowen, MD    Family History Family History  Problem Relation Age of Onset  . Thyroid disease Sister   . Depression Paternal Grandmother   . Schizophrenia Paternal Grandmother     Social History Social History  Substance Use Topics  . Smoking status: Never Smoker  . Smokeless tobacco: Never Used  . Alcohol use 1.2 oz/week    1 Glasses of wine, 1 Cans of beer per week     Comment: twice weekly     Allergies   Clarithromycin; Erythromycin; Ibuprofen; Levofloxacin; and Penicillins  Review of Systems Review of Systems   A complete 10 system review of systems was obtained and all systems are negative except as noted in the HPI and PMH.    Physical Exam Updated Vital Signs BP 128/83   Pulse 81   Temp 98.2 F (36.8 C) (Oral)   Resp 18   Ht 6\' 2"  (1.88 m)   Wt 255 lb (115.7 kg)   SpO2 96%   BMI 32.74 kg/m   Physical Exam  Constitutional: He is oriented to person, place, and time. He appears well-developed and well-nourished.  Discomfort  HENT:  Head: Normocephalic and atraumatic.  Eyes: EOM are normal.  Neck: Normal range of motion.    Cardiovascular: Normal rate, regular rhythm, normal heart sounds and intact distal pulses.   Pulmonary/Chest: Effort normal and breath sounds normal. No respiratory distress.  Abdominal: Soft. He exhibits no distension. There is no tenderness.  Musculoskeletal: Normal range of motion.  Neurological: He is alert and oriented to person, place, and time.  Skin: Skin is warm and dry.  Psychiatric: He has a normal mood and affect. Judgment normal.  Nursing note and vitals reviewed.    ED Treatments / Results   DIAGNOSTIC STUDIES: Oxygen Saturation is 100% on room air, normal by my interpretation.  COORDINATION OF CARE: 8:28 PM Discussed treatment plan with pt at bedside and pt agreed to plan.   Labs (all labs ordered are listed, but only abnormal results are displayed) Labs Reviewed  BASIC METABOLIC PANEL - Abnormal; Notable for the following:       Result Value   Glucose, Bld 119 (*)    All other components within normal limits  CBC WITH DIFFERENTIAL/PLATELET - Abnormal; Notable for the following:    WBC 17.0 (*)    Platelets 422 (*)    Neutro Abs 13.5 (*)    Monocytes Absolute 1.1 (*)    All other components within normal limits  URINALYSIS, ROUTINE W REFLEX MICROSCOPIC - Abnormal; Notable for the following:    Hgb urine dipstick LARGE (*)    Ketones, ur 15 (*)    All other components within normal limits  URINALYSIS, MICROSCOPIC (REFLEX) - Abnormal; Notable for the following:    Bacteria, UA FEW (*)    Squamous Epithelial / LPF 0-5 (*)    All other components within normal limits    EKG  EKG Interpretation None       Radiology Ct Renal Stone Study  Result Date: 06/04/2016 CLINICAL DATA:  35 year old male with right flank pain. EXAM: CT ABDOMEN AND PELVIS WITHOUT CONTRAST TECHNIQUE: Multidetector CT imaging of the abdomen and pelvis was performed following the standard protocol without IV contrast. COMPARISON:  Abdominal CT dated 12/09/2015 FINDINGS: Evaluation of  this exam is limited in the absence of intravenous contrast. Lower chest: Minimal bibasilar dependent atelectatic changes of the lungs. The visualized lung bases are otherwise clear. No intra-abdominal free air or free fluid. Hepatobiliary: There is diffuse fatty infiltration of the liver. No intrahepatic biliary ductal dilatation. The gallbladder is unremarkable. Pancreas: Unremarkable. No pancreatic ductal dilatation or surrounding inflammatory changes. Spleen: Normal in size without focal abnormality. Adrenals/Urinary Tract: The adrenal glands appear unremarkable. There is a 5 mm distal right ureteral stone with mild right hydronephrosis. Multiple nonobstructing left renal calculi measuring up to 5 mm in the inferior pole of the left kidney. There is no hydronephrosis on the left. The left ureter and urinary bladder appear unremarkable. Stomach/Bowel: Stomach is within normal limits. Appendix appears  normal. No evidence of bowel wall thickening, distention, or inflammatory changes. Vascular/Lymphatic: The abdominal aorta and IVC are grossly unremarkable on this noncontrast study. No portal venous gas identified. There is no adenopathy. Reproductive: The prostate and seminal vesicles are grossly unremarkable. Other: Small fat containing umbilical hernia.  No fluid collection Musculoskeletal: No acute or significant osseous findings. IMPRESSION: A 5 mm distal right ureteral stone with mild right hydronephrosis. Nonobstructing left renal calculi measure up to 5 mm. No hydronephrosis on the left. Electronically Signed   By: Elgie CollardArash  Radparvar M.D.   On: 06/04/2016 23:29    Procedures Procedures (including critical care time)  Medications Ordered in ED Medications  HYDROmorphone (DILAUDID) injection 1 mg (not administered)  ondansetron (ZOFRAN) injection 4 mg (not administered)  sodium chloride 0.9 % bolus 1,000 mL (0 mLs Intravenous Stopped 06/04/16 2304)  morphine 4 MG/ML injection 4 mg (4 mg Intravenous  Given 06/04/16 2040)  ondansetron (ZOFRAN) injection 4 mg (4 mg Intravenous Given 06/04/16 2040)  HYDROmorphone (DILAUDID) injection 1 mg (1 mg Intravenous Given 06/04/16 2143)  sodium chloride 0.9 % bolus 1,000 mL (0 mLs Intravenous Stopped 06/04/16 2211)     Initial Impression / Assessment and Plan / ED Course  I have reviewed the triage vital signs and the nursing notes.  Pertinent labs & imaging results that were available during my care of the patient were reviewed by me and considered in my medical decision making (see chart for details).  Clinical Course     Pain has resolved.  He is instructed to f/u with urology.  He knows to return if worse.  Final Clinical Impressions(s) / ED Diagnoses   Final diagnoses:  Ureteral colic   I personally performed the services described in this documentation, which was scribed in my presence. The recorded information has been reviewed and is accurate.   New Prescriptions Current Discharge Medication List       Jacalyn LefevreJulie Ardell Aaronson, MD 06/04/16 2340

## 2016-06-04 NOTE — ED Notes (Signed)
Pt to CT

## 2016-06-04 NOTE — ED Notes (Signed)
Patient asked for a urine specimine he reports that he is unable at this time

## 2016-06-04 NOTE — ED Triage Notes (Signed)
Patient reports that he has N/V and right sided flank pain - history of kidney stones

## 2016-06-04 NOTE — ED Notes (Signed)
Back from CT

## 2016-06-04 NOTE — ED Notes (Signed)
Missed IV  attempts x 2 to Right AC and RFA, tol well.

## 2016-06-04 NOTE — ED Notes (Signed)
H/o kidney stones, lives in [place]/ [place], [place] [facility] [place], c/o R flank pain c/w kidney stone, last CT 1 yr ago, also reports nv, and intermitant migraine HAs (denies: HA at this time, fever, diarrhea, dizziness, change in stream, hematuria).

## 2017-06-25 DIAGNOSIS — F411 Generalized anxiety disorder: Secondary | ICD-10-CM | POA: Insufficient documentation

## 2017-06-25 HISTORY — DX: Generalized anxiety disorder: F41.1

## 2017-07-20 IMAGING — CT CT RENAL STONE PROTOCOL
2 of 4 series · 16 of 46 positions shown, 18 images · non-contrast
Comparison: Abdominal CT dated 12/09/2015

CLINICAL DATA: 35-year-old male with right flank pain.

EXAM:
CT ABDOMEN AND PELVIS WITHOUT CONTRAST
TECHNIQUE: Multidetector CT imaging of the abdomen and pelvis was performed
following the standard protocol without IV contrast.

[Series 2: axial st · axial · 0.98mm/px · z∈[-459,+6]mm · 13 of 103 slices shown, 15 images]
[im 5/103  soft-tissue]
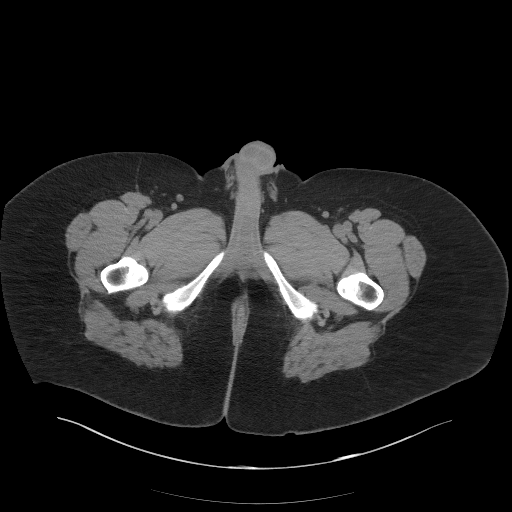
[im 5/103  bone]
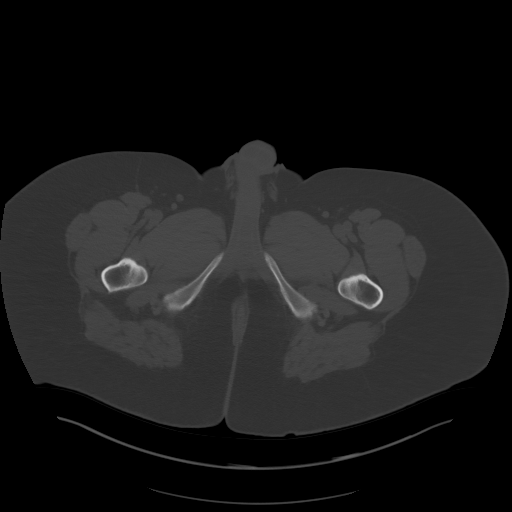
[im 13/103  soft-tissue]
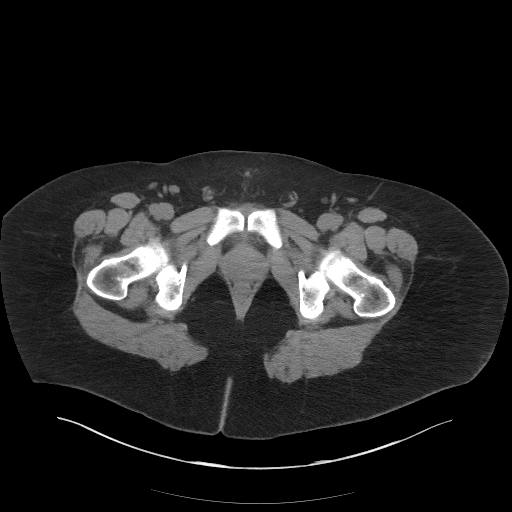
[im 21/103  soft-tissue]
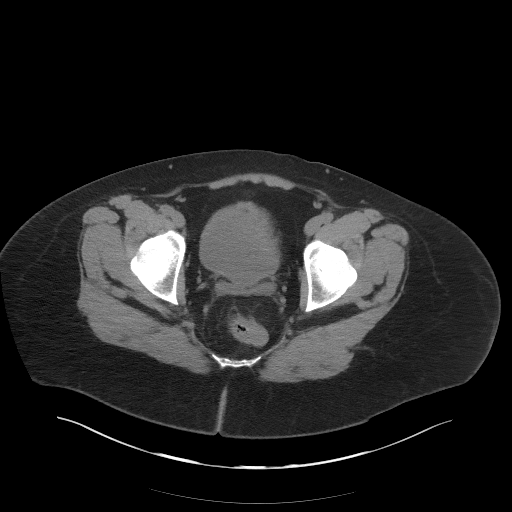
[im 29/103  soft-tissue]
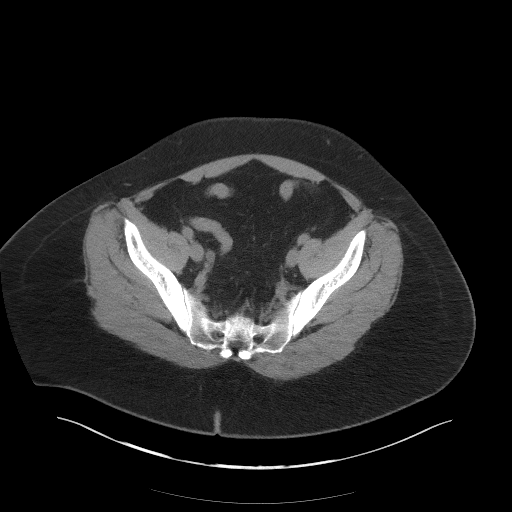
[im 37/103  soft-tissue]
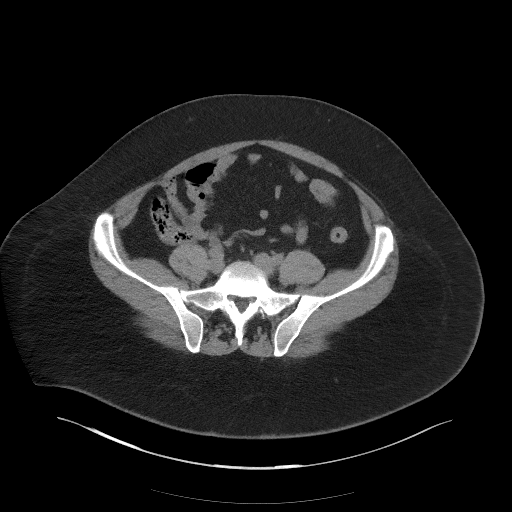
[im 45/103  soft-tissue]
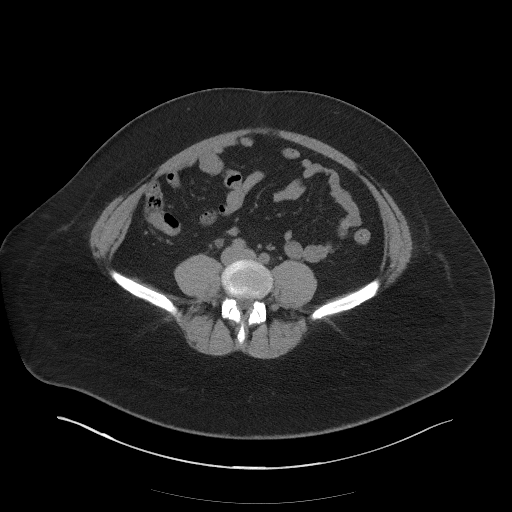
[im 54/103  soft-tissue]
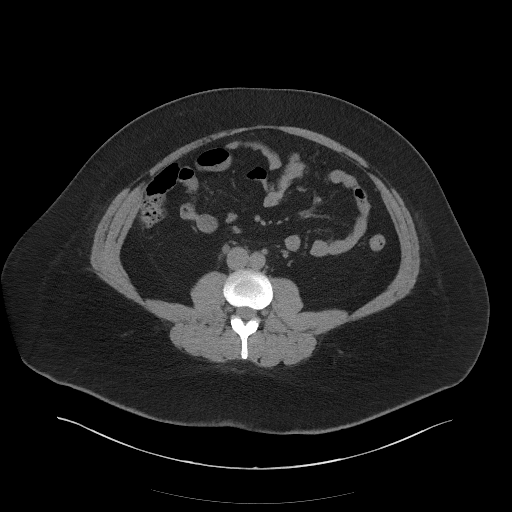
[im 58/103  soft-tissue]
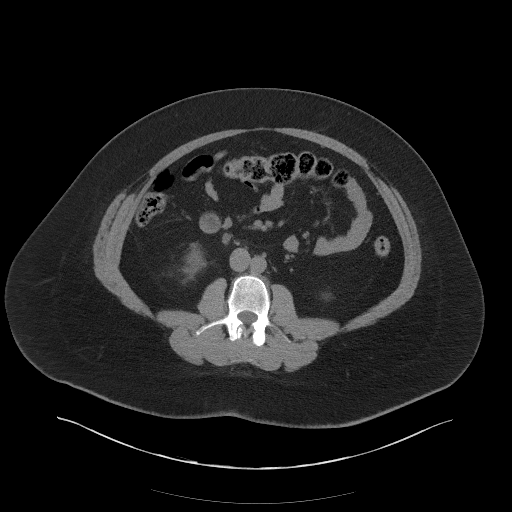
[im 66/103  soft-tissue]
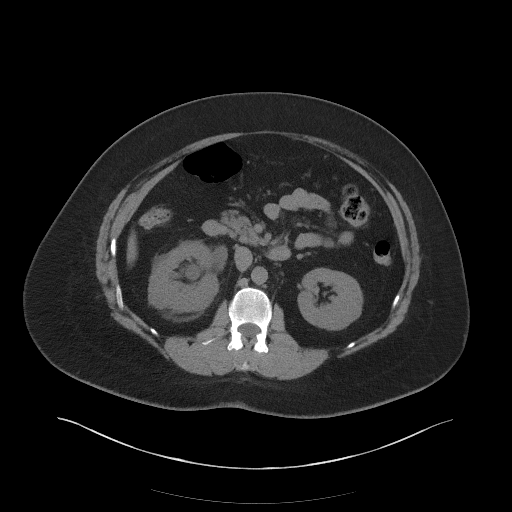
[im 66/103  bone]
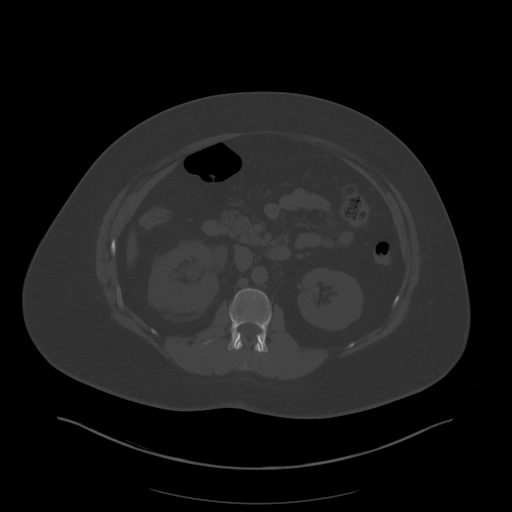
[im 74/103  soft-tissue]
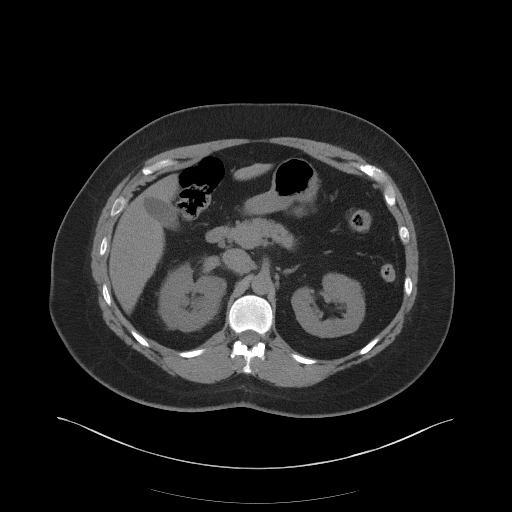
[im 82/103  soft-tissue]
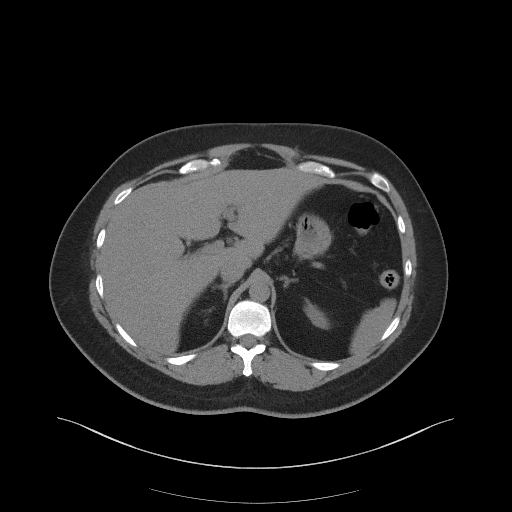
[im 90/103  soft-tissue]
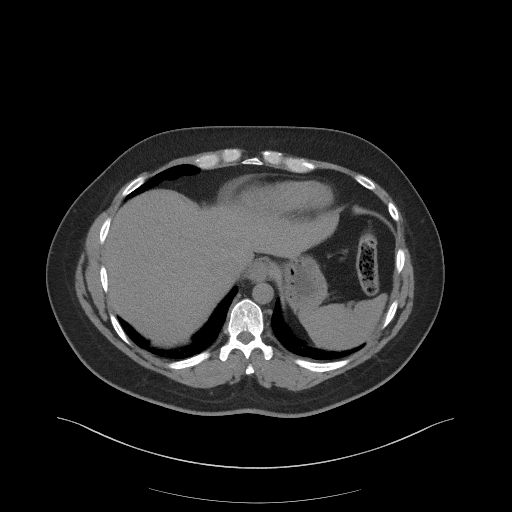
[im 98/103  soft-tissue]
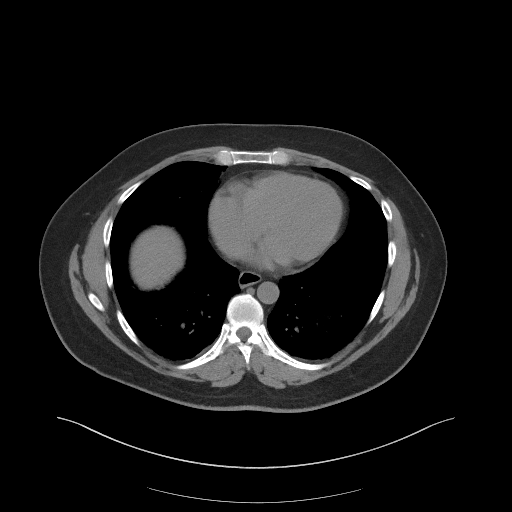

[Series 4: coronal st · coronal · 0.97mm/px · 3 of 125 slices shown]
[im 42/125  soft-tissue]
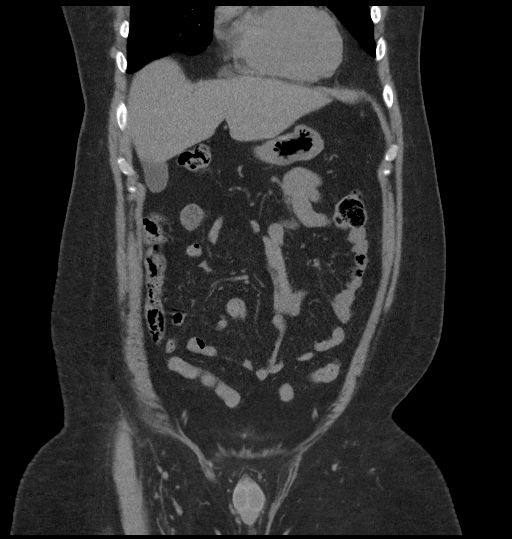
[im 56/125  soft-tissue]
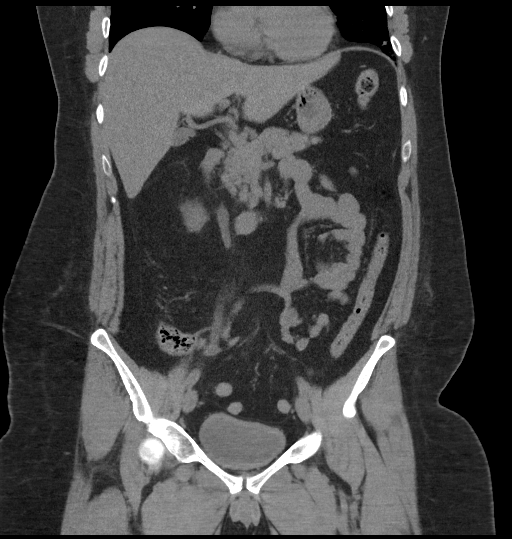
[im 69/125  soft-tissue]
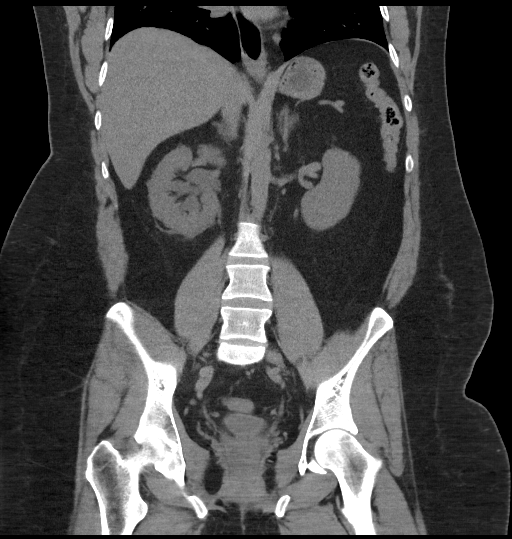

[16 of 46 positions shown; findings below may reference images not displayed]

FINDINGS: Evaluation of this exam is limited in the absence of intravenous
contrast.

Lower chest: Minimal bibasilar dependent atelectatic changes of the
lungs. The visualized lung bases are otherwise clear.

No intra-abdominal free air or free fluid.

Hepatobiliary: There is diffuse fatty infiltration of the liver. No
intrahepatic biliary ductal dilatation. The gallbladder is
unremarkable.

Pancreas: Unremarkable. No pancreatic ductal dilatation or
surrounding inflammatory changes.

Spleen: Normal in size without focal abnormality.

Adrenals/Urinary Tract: The adrenal glands appear unremarkable.
There is a 5 mm distal right ureteral stone with mild right
hydronephrosis. Multiple nonobstructing left renal calculi measuring
up to 5 mm in the inferior pole of the left kidney. There is no
hydronephrosis on the left. The left ureter and urinary bladder
appear unremarkable.

Stomach/Bowel: Stomach is within normal limits. Appendix appears
normal. No evidence of bowel wall thickening, distention, or
inflammatory changes.

Vascular/Lymphatic: The abdominal aorta and IVC are grossly
unremarkable on this noncontrast study. No portal venous gas
identified. There is no adenopathy.

Reproductive: The prostate and seminal vesicles are grossly
unremarkable.

Other: Small fat containing umbilical hernia.  No fluid collection

Musculoskeletal: No acute or significant osseous findings.
IMPRESSION: A 5 mm distal right ureteral stone with mild right hydronephrosis.

Nonobstructing left renal calculi measure up to 5 mm. No
hydronephrosis on the left.

## 2018-01-07 ENCOUNTER — Emergency Department (HOSPITAL_BASED_OUTPATIENT_CLINIC_OR_DEPARTMENT_OTHER): Payer: No Typology Code available for payment source

## 2018-01-07 ENCOUNTER — Encounter (HOSPITAL_BASED_OUTPATIENT_CLINIC_OR_DEPARTMENT_OTHER): Payer: Self-pay

## 2018-01-07 ENCOUNTER — Other Ambulatory Visit: Payer: Self-pay

## 2018-01-07 ENCOUNTER — Emergency Department (HOSPITAL_BASED_OUTPATIENT_CLINIC_OR_DEPARTMENT_OTHER)
Admission: EM | Admit: 2018-01-07 | Discharge: 2018-01-07 | Disposition: A | Discharge status: Home / Self Care | Payer: No Typology Code available for payment source | Attending: Emergency Medicine | Admitting: Emergency Medicine

## 2018-01-07 DIAGNOSIS — N201 Calculus of ureter: Secondary | ICD-10-CM | POA: Insufficient documentation

## 2018-01-07 DIAGNOSIS — F4323 Adjustment disorder with mixed anxiety and depressed mood: Secondary | ICD-10-CM | POA: Insufficient documentation

## 2018-01-07 DIAGNOSIS — F329 Major depressive disorder, single episode, unspecified: Secondary | ICD-10-CM | POA: Insufficient documentation

## 2018-01-07 DIAGNOSIS — R1031 Right lower quadrant pain: Secondary | ICD-10-CM | POA: Diagnosis present

## 2018-01-07 DIAGNOSIS — R109 Unspecified abdominal pain: Secondary | ICD-10-CM

## 2018-01-07 DIAGNOSIS — F419 Anxiety disorder, unspecified: Secondary | ICD-10-CM | POA: Diagnosis not present

## 2018-01-07 DIAGNOSIS — R110 Nausea: Secondary | ICD-10-CM

## 2018-01-07 LAB — BASIC METABOLIC PANEL
ANION GAP: 9 (ref 5–15)
BUN: 12 mg/dL (ref 6–20)
CALCIUM: 8.9 mg/dL (ref 8.9–10.3)
CO2: 24 mmol/L (ref 22–32)
CREATININE: 0.9 mg/dL (ref 0.61–1.24)
Chloride: 104 mmol/L (ref 98–111)
Glucose, Bld: 90 mg/dL (ref 70–99)
Potassium: 4 mmol/L (ref 3.5–5.1)
Sodium: 137 mmol/L (ref 135–145)

## 2018-01-07 LAB — URINALYSIS, ROUTINE W REFLEX MICROSCOPIC
BILIRUBIN URINE: NEGATIVE
Glucose, UA: NEGATIVE mg/dL
KETONES UR: 40 mg/dL — AB
LEUKOCYTES UA: NEGATIVE
NITRITE: NEGATIVE
PROTEIN: NEGATIVE mg/dL
Specific Gravity, Urine: 1.01 (ref 1.005–1.030)
pH: 6 (ref 5.0–8.0)

## 2018-01-07 LAB — CBC WITH DIFFERENTIAL/PLATELET
BASOS ABS: 0.1 10*3/uL (ref 0.0–0.1)
BASOS PCT: 0 %
EOS ABS: 0 10*3/uL (ref 0.0–0.7)
Eosinophils Relative: 0 %
HEMATOCRIT: 46.5 % (ref 39.0–52.0)
Hemoglobin: 15.7 g/dL (ref 13.0–17.0)
Lymphocytes Relative: 14 %
Lymphs Abs: 2.2 10*3/uL (ref 0.7–4.0)
MCH: 30.6 pg (ref 26.0–34.0)
MCHC: 33.8 g/dL (ref 30.0–36.0)
MCV: 90.6 fL (ref 78.0–100.0)
MONO ABS: 1 10*3/uL (ref 0.1–1.0)
MONOS PCT: 6 %
NEUTROS ABS: 12.9 10*3/uL — AB (ref 1.7–7.7)
Neutrophils Relative %: 80 %
Platelets: 426 10*3/uL — ABNORMAL HIGH (ref 150–400)
RBC: 5.13 MIL/uL (ref 4.22–5.81)
RDW: 13.8 % (ref 11.5–15.5)
WBC: 16.2 10*3/uL — ABNORMAL HIGH (ref 4.0–10.5)

## 2018-01-07 LAB — URINALYSIS, MICROSCOPIC (REFLEX): RBC / HPF: NONE SEEN RBC/hpf (ref 0–5)

## 2018-01-07 MED ORDER — KETOROLAC TROMETHAMINE 30 MG/ML IJ SOLN
30.0000 mg | Freq: Once | INTRAMUSCULAR | Status: AC
  Administered 2018-01-07: 30 mg via INTRAVENOUS
  Filled 2018-01-07: qty 1

## 2018-01-07 MED ORDER — TAMSULOSIN HCL 0.4 MG PO CAPS: 0.4000 mg | ORAL_CAPSULE | Freq: Every day | ORAL | 0 refills | Status: AC

## 2018-01-07 MED ORDER — IBUPROFEN 800 MG PO TABS: 800.0000 mg | ORAL_TABLET | Freq: Three times a day (TID) | ORAL | 0 refills | Status: DC | PRN

## 2018-01-07 MED ORDER — ONDANSETRON 4 MG PO TBDP: 4.0000 mg | ORAL_TABLET | Freq: Three times a day (TID) | ORAL | 0 refills | Status: DC | PRN

## 2018-01-07 MED ORDER — ONDANSETRON HCL 4 MG/2ML IJ SOLN
4.0000 mg | Freq: Once | INTRAMUSCULAR | Status: AC
  Administered 2018-01-07: 4 mg via INTRAVENOUS
  Filled 2018-01-07: qty 2

## 2018-01-07 MED ORDER — OXYCODONE-ACETAMINOPHEN 5-325 MG PO TABS: 1.0000 | ORAL_TABLET | Freq: Four times a day (QID) | ORAL | 0 refills | Status: DC | PRN

## 2018-01-07 MED ORDER — OXYCODONE-ACETAMINOPHEN 5-325 MG PO TABS
2.0000 | ORAL_TABLET | Freq: Once | ORAL | Status: AC
  Administered 2018-01-07: 2 via ORAL
  Filled 2018-01-07: qty 2

## 2018-01-07 NOTE — Discharge Instructions (Signed)
You have been seen in the Emergency Department (ED) today for pain that we believe based on your workup, is caused by kidney stones.  As we have discussed, please drink plenty of fluids.  Please make a follow up appointment with the physician(s) listed elsewhere in this documentation. ° °You may take pain medication as needed but ONLY as prescribed.  Please also take your prescribed Flomax daily.  We also recommend that you take over-the-counter ibuprofen regularly according to label instructions over the next 5 days.  Take it with meals to minimize stomach discomfort. ° °Please see your doctor as soon as possible as stones may take 1-3 weeks to pass and you may require additional care or medications. ° °Do not drink alcohol, drive or participate in any other potentially dangerous activities while taking opiate pain medication as it may make you sleepy. Do not take this medication with any other sedating medications, either prescription or over-the-counter. If you were prescribed Percocet or Vicodin, do not take these with acetaminophen (Tylenol) as it is already contained within these medications. °  °Take Percocet as needed for severe pain.  This medication is an opiate (or narcotic) pain medication and can be habit forming.  Use it as little as possible to achieve adequate pain control.  Do not use or use it with extreme caution if you have a history of opiate abuse or dependence.  If you are on a pain contract with your primary care doctor or a pain specialist, be sure to let them know you were prescribed this medication today from the Emergency Department.  This medication is intended for your use only - do not give any to anyone else and keep it in a secure place where nobody else, especially children, have access to it.  It will also cause or worsen constipation, so you may want to consider taking an over-the-counter stool softener while you are taking this medication. ° °Return to the Emergency Department  (ED) or call your doctor if you have any worsening pain, fever, painful urination, are unable to urinate, or develop other symptoms that concern you. ° ° ° °Kidney Stones °Kidney stones (urolithiasis) are deposits that form inside your kidneys. The intense pain is caused by the stone moving through the urinary tract. When the stone moves, the ureter goes into spasm around the stone. The stone is usually passed in the urine.  °CAUSES  °A disorder that makes certain neck glands produce too much parathyroid hormone (primary hyperparathyroidism). °A buildup of uric acid crystals, similar to gout in your joints. °Narrowing (stricture) of the ureter. °A kidney obstruction present at birth (congenital obstruction). °Previous surgery on the kidney or ureters. °Numerous kidney infections. °SYMPTOMS  °Feeling sick to your stomach (nauseous). °Throwing up (vomiting). °Blood in the urine (hematuria). °Pain that usually spreads (radiates) to the groin. °Frequency or urgency of urination. °DIAGNOSIS  °Taking a history and physical exam. °Blood or urine tests. °CT scan. °Occasionally, an examination of the inside of the urinary bladder (cystoscopy) is performed. °TREATMENT  °Observation. °Increasing your fluid intake. °Extracorporeal shock wave lithotripsy--This is a noninvasive procedure that uses shock waves to break up kidney stones. °Surgery may be needed if you have severe pain or persistent obstruction. There are various surgical procedures. Most of the procedures are performed with the use of small instruments. Only small incisions are needed to accommodate these instruments, so recovery time is minimized. °The size, location, and chemical composition are all important variables that will determine the proper   choice of action for you. Talk to your health care provider to better understand your situation so that you will minimize the risk of injury to yourself and your kidney.  °HOME CARE INSTRUCTIONS  °Drink enough water  and fluids to keep your urine clear or pale yellow. This will help you to pass the stone or stone fragments. °Strain all urine through the provided strainer. Keep all particulate matter and stones for your health care provider to see. The stone causing the pain may be as small as a grain of salt. It is very important to use the strainer each and every time you pass your urine. The collection of your stone will allow your health care provider to analyze it and verify that a stone has actually passed. The stone analysis will often identify what you can do to reduce the incidence of recurrences. °Only take over-the-counter or prescription medicines for pain, discomfort, or fever as directed by your health care provider. °Keep all follow-up visits as told by your health care provider. This is important. °Get follow-up X-rays if required. The absence of pain does not always mean that the stone has passed. It may have only stopped moving. If the urine remains completely obstructed, it can cause loss of kidney function or even complete destruction of the kidney. It is your responsibility to make sure X-rays and follow-ups are completed. Ultrasounds of the kidney can show blockages and the status of the kidney. Ultrasounds are not associated with any radiation and can be performed easily in a matter of minutes. °Make changes to your daily diet as told by your health care provider. You may be told to: °Limit the amount of salt that you eat. °Eat 5 or more servings of fruits and vegetables each day. °Limit the amount of meat, poultry, fish, and eggs that you eat. °Collect a 24-hour urine sample as told by your health care provider. You may need to collect another urine sample every 6-12 months. °SEEK MEDICAL CARE IF: °You experience pain that is progressive and unresponsive to any pain medicine you have been prescribed. °SEEK IMMEDIATE MEDICAL CARE IF:  °Pain cannot be controlled with the prescribed medicine. °You have a  fever or shaking chills. °The severity or intensity of pain increases over 18 hours and is not relieved by pain medicine. °You develop a new onset of abdominal pain. °You feel faint or pass out. °You are unable to urinate. °  °This information is not intended to replace advice given to you by your health care provider. Make sure you discuss any questions you have with your health care provider. °  °Document Released: 06/05/2005 Document Revised: 02/24/2015 Document Reviewed: 11/06/2012 °Elsevier Interactive Patient Education ©2016 [employer] ° ° °

## 2018-01-07 NOTE — ED Provider Notes (Signed)
Emergency Department Provider Note   I have reviewed the triage vital signs and the nursing notes.   HISTORY  Chief Complaint Flank Pain   HPI Keith Walton is a 37 y.o. male with PMH of IBS and prior kidney stones presents to the emergency department for evaluation of acute onset right flank pain radiating to the groin with nausea, vomiting.  Patient states this feels similar to prior kidney stones in the past.  He has never required stent placement or lithotripsy.  He states 3 weeks ago he went to the emergency department in [place] where a CT was performed showing an 8 mm stone on the right.  He was treated with pain medication but did not follow-up with urology.  His symptoms resolved but today returned.  He states he began with nausea and vomiting but then the right sided abdominal/flank pain developed.  Denies any fevers or chills.  Denies hematuria.     Past Medical History:  Diagnosis Date  . Anxiety and depression   . Hemorrhoids    int and ext: hx of BRBPR  . IBS (irritable bowel syndrome)    alt constip/diarrhea [facility])  . Kidney stone   . Migraine syndrome     Patient Active Problem List   Diagnosis Date Noted  . Unintentional weight loss 03/23/2014  . Adjustment disorder with mixed anxiety and depressed mood 01/02/2014  . ANXIETY 03/05/2007  . ALLERGIC RHINITIS 03/05/2007  . HEADACHE 03/05/2007    Past Surgical History:  Procedure Laterality Date  . COLONOSCOPY  03/28/2012   "Small int hemorrhoids".  Otherwise normal.  Repeat at age 13 was recommended.  . ESOPHAGOGASTRODUODENOSCOPY  03/28/2012   Duodenal bx: normal small bowel mucosa.  Gastric bx: minimal superficial chronic inflammation--H pylori neg.    Allergies Clarithromycin; Erythromycin; Ibuprofen; Levofloxacin; and Penicillins  Family History  Problem Relation Age of Onset  . Thyroid disease Sister   . Depression Paternal Grandmother   . Schizophrenia Paternal  Grandmother     Social History Social History   Tobacco Use  . Smoking status: Never Smoker  . Smokeless tobacco: Never Used  Substance Use Topics  . Alcohol use: Yes    Comment: weekly  . Drug use: No    Review of Systems  Constitutional: No fever/chills Eyes: No visual changes. ENT: No sore throat. Cardiovascular: Denies chest pain. Respiratory: Denies shortness of breath. Gastrointestinal: No abdominal pain. Positive nausea and vomiting.  No diarrhea.  No constipation. Genitourinary: Negative for dysuria. Positive right flank pain. Musculoskeletal: Negative for back pain. Skin: Negative for rash. Neurological: Negative for headaches, focal weakness or numbness.  10-point ROS otherwise negative.  ____________________________________________   PHYSICAL EXAM:  VITAL SIGNS: ED Triage Vitals  Enc Vitals Group     BP 01/07/18 1638 (!) 130/91     Pulse Rate 01/07/18 1638 (!) 110     Resp 01/07/18 1638 (!) 22     Temp 01/07/18 1638 98.6 F (37 C)     Temp Source 01/07/18 1638 Oral     SpO2 01/07/18 1638 100 %     Weight 01/07/18 1637 277 lb (125.6 kg)     Height 01/07/18 1637 6\' 2"  (1.88 m)     Pain Score 01/07/18 1636 8   Constitutional: Alert and oriented. Appears moderately uncomfortable with frequent shifting in the bed.  Eyes: Conjunctivae are normal. Head: Atraumatic. Nose: No congestion/rhinnorhea. Mouth/Throat: Mucous membranes are moist.  Oropharynx non-erythematous. Neck: No stridor.  Cardiovascular: Tachycardia.  Good peripheral circulation. Grossly normal heart sounds.   Respiratory: Normal respiratory effort.  No retractions. Lungs CTAB. Gastrointestinal: Soft and nontender. No distention.  Musculoskeletal: No lower extremity tenderness nor edema. No gross deformities of extremities. Neurologic:  Normal speech and language. No gross focal neurologic deficits are appreciated.  Skin:  Skin is warm, dry and intact. No rash  noted.  ____________________________________________   LABS (all labs ordered are listed, but only abnormal results are displayed)  Labs Reviewed  URINALYSIS, ROUTINE W REFLEX MICROSCOPIC - Abnormal; Notable for the following components:      Result Value   Hgb urine dipstick TRACE (*)    Ketones, ur 40 (*)    All other components within normal limits  CBC WITH DIFFERENTIAL/PLATELET - Abnormal; Notable for the following components:   WBC 16.2 (*)    Platelets 426 (*)    Neutro Abs 12.9 (*)    All other components within normal limits  URINALYSIS, MICROSCOPIC (REFLEX) - Abnormal; Notable for the following components:   Bacteria, UA RARE (*)    All other components within normal limits  URINE CULTURE  BASIC METABOLIC PANEL   __________________________________  RADIOLOGY  Ct Renal Stone Study  Result Date: 01/07/2018 CLINICAL DATA:  RIGHT flank and groin pain today. History of kidney stones. EXAM: CT ABDOMEN AND PELVIS WITHOUT CONTRAST TECHNIQUE: Multidetector CT imaging of the abdomen and pelvis was performed following the standard protocol without IV contrast. COMPARISON:  CT abdomen and pelvis June 04, 2016 FINDINGS: LOWER CHEST: Lung bases are clear. The visualized heart size is normal. No pericardial effusion. HEPATOBILIARY: Normal. PANCREAS: Normal. SPLEEN: Normal. ADRENALS/URINARY TRACT: Kidneys are orthotopic, demonstrating normal size and morphology. Moderate RIGHT hydroureteronephrosis to the level of the distal ureter where a 6 mm calculus is present. Punctate LEFT lower pole, 5 mm LEFT lower pole nephrolithiasis without hydronephrosis. Limited assessment for renal masses by nonenhanced CT. Urinary bladder is partially distended and unremarkable. Normal adrenal glands. STOMACH/BOWEL: The stomach, small and large bowel are normal in course and caliber without inflammatory changes, sensitivity decreased by lack of enteric contrast. Normal appendix. VASCULAR/LYMPHATIC:  Aortoiliac vessels are normal in course and caliber. No lymphadenopathy by CT size criteria. REPRODUCTIVE: Normal. OTHER: No intraperitoneal free fluid or free air. MUSCULOSKELETAL: Non-acute. Small wide necked fat containing umbilical hernia. IMPRESSION: 1. 6 mm distal RIGHT ureteral calculus resulting in moderate obstructive uropathy. 2. Nonobstructing LEFT nephrolithiasis measuring to 5 mm. Electronically Signed   By: Awilda Metroourtnay  Bloomer M.D.   On: 01/07/2018 17:46    ____________________________________________   PROCEDURES  Procedure(s) performed:   Procedures  None ____________________________________________   INITIAL IMPRESSION / ASSESSMENT AND PLAN / ED COURSE  Pertinent labs & imaging results that were available during my care of the patient were reviewed by me and considered in my medical decision making (see chart for details).  Patient presents to the emergency department for evaluation of right flank pain radiating to the groin similar to prior kidney stones.  I am considering CT scan in this case despite his history because of recent CT showing an 8 mm stone on that side.  It is unclear to me where exactly the stone was positioned.  Would expect an 8 mm stone to have significant difficulty passing.  Plan for pain control, CT, labs and reassess.  CT with 6 mm proximal stone. Pain well controlled at this time. Labs with no evidence of AKI or infection. Plan for close Urology follow up.  At this time, I do  not feel there is any life-threatening condition present. I have reviewed and discussed all results (EKG, imaging, lab, urine as appropriate), exam findings with patient. I have reviewed nursing notes and appropriate previous records.  I feel the patient is safe to be discharged home without further emergent workup. Discussed usual and customary return precautions. Patient and family (if present) verbalize understanding and are comfortable with this plan.  Patient will follow-up  with their primary care provider. If they do not have a primary care provider, information for follow-up has been provided to them. All questions have been answered.  ____________________________________________  FINAL CLINICAL IMPRESSION(S) / ED DIAGNOSES  Final diagnoses:  Ureterolithiasis  Right flank pain  Nausea     MEDICATIONS GIVEN DURING THIS VISIT:  Medications  ondansetron (ZOFRAN) injection 4 mg (4 mg Intravenous Given 01/07/18 1743)  ketorolac (TORADOL) 30 MG/ML injection 30 mg (30 mg Intravenous Given 01/07/18 1744)  oxyCODONE-acetaminophen (PERCOCET/ROXICET) 5-325 MG per tablet 2 tablet (2 tablets Oral Given 01/07/18 1906)     NEW OUTPATIENT MEDICATIONS STARTED DURING THIS VISIT:  Discharge Medication List as of 01/07/2018  7:55 PM    START taking these medications   Details  ibuprofen (ADVIL,MOTRIN) 800 MG tablet Take 1 tablet (800 mg total) by mouth every 8 (eight) hours as needed., Starting Mon 01/07/2018, Print        Note:  This document was prepared using Dragon voice recognition software and may include unintentional dictation errors.  Alona Bene, MD Emergency Medicine    Long, Arlyss Repress, MD 01/08/18 [phone]

## 2018-01-07 NOTE — ED Notes (Signed)
Pt verbalized understanding of dc instructions.

## 2018-01-07 NOTE — ED Notes (Signed)
Patient transported to CT 

## 2018-01-07 NOTE — ED Triage Notes (Signed)
C/o right flank pain, right groin pain, n/v/d-started 1130am-states feels like kidney stone-slow gait/grimacing

## 2018-01-07 NOTE — ED Notes (Signed)
Pt sts does not think he can provide urine sample at this time.

## 2018-01-07 NOTE — ED Notes (Signed)
Sprite provided per pt request.  

## 2018-01-09 LAB — URINE CULTURE
Culture: <10000 — AB
SPECIAL REQUESTS: NORMAL

## 2018-02-04 ENCOUNTER — Other Ambulatory Visit: Payer: Self-pay | Admitting: Urology

## 2018-02-04 ENCOUNTER — Other Ambulatory Visit: Payer: Self-pay

## 2018-02-04 ENCOUNTER — Encounter (HOSPITAL_COMMUNITY): Payer: Self-pay | Admitting: *Deleted

## 2018-02-04 NOTE — Anesthesia Preprocedure Evaluation (Addendum)
Anesthesia Evaluation  Patient identified by MRN, date of birth, ID band Patient awake    Reviewed: Allergy & Precautions, NPO status , Patient's Chart, lab work & pertinent test results  Airway Mallampati: II  TM Distance: >3 FB Neck ROM: Full    Dental no notable dental hx. (+) Teeth Intact, Dental Advisory Given   Pulmonary neg pulmonary ROS,    Pulmonary exam normal breath sounds clear to auscultation       Cardiovascular Exercise Tolerance: Good negative cardio ROS Normal cardiovascular exam Rhythm:Regular Rate:Normal     Neuro/Psych  Headaches, Anxiety    GI/Hepatic negative GI ROS, Neg liver ROS,   Endo/Other  Morbid obesity  Renal/GU      Musculoskeletal negative musculoskeletal ROS (+)   Abdominal (+) + obese,   Peds  Hematology negative hematology ROS (+)   Anesthesia Other Findings Cysto and Laser lithotripsy  Reproductive/Obstetrics                              Anesthesia Physical Anesthesia Plan  ASA: II  Anesthesia Plan: General   Post-op Pain Management:    Induction: Intravenous  PONV Risk Score and Plan: Ondansetron, Dexamethasone and Treatment may vary due to age or medical condition  Airway Management Planned: Oral ETT  Additional Equipment:   Intra-op Plan:   Post-operative Plan: Extubation in OR  Informed Consent: I have reviewed the patients History and Physical, chart, labs and discussed the procedure including the risks, benefits and alternatives for the proposed anesthesia with the patient or authorized representative who has indicated his/her understanding and acceptance.   Dental advisory given  Plan Discussed with: CRNA  Anesthesia Plan Comments:         Anesthesia Quick Evaluation

## 2018-02-05 ENCOUNTER — Ambulatory Visit (HOSPITAL_COMMUNITY): Payer: 59 | Admitting: Anesthesiology

## 2018-02-05 ENCOUNTER — Encounter (HOSPITAL_COMMUNITY): Payer: Self-pay | Admitting: *Deleted

## 2018-02-05 ENCOUNTER — Ambulatory Visit (HOSPITAL_COMMUNITY)
Admission: RE | Admit: 2018-02-05 | Discharge: 2018-02-05 | Disposition: A | Discharge status: Home / Self Care | Payer: 59 | Source: Ambulatory Visit | Attending: Urology | Admitting: Urology

## 2018-02-05 ENCOUNTER — Ambulatory Visit (HOSPITAL_COMMUNITY): Payer: 59

## 2018-02-05 ENCOUNTER — Inpatient Hospital Stay (HOSPITAL_COMMUNITY): Admission: RE | Admit: 2018-02-05 | Payer: 59 | Source: Ambulatory Visit

## 2018-02-05 ENCOUNTER — Encounter (HOSPITAL_COMMUNITY)
Admission: RE | Disposition: A | Discharge status: Home / Self Care | Payer: Self-pay | Source: Ambulatory Visit | Attending: Urology

## 2018-02-05 DIAGNOSIS — Z791 Long term (current) use of non-steroidal anti-inflammatories (NSAID): Secondary | ICD-10-CM | POA: Diagnosis not present

## 2018-02-05 DIAGNOSIS — N132 Hydronephrosis with renal and ureteral calculous obstruction: Secondary | ICD-10-CM | POA: Insufficient documentation

## 2018-02-05 DIAGNOSIS — N201 Calculus of ureter: Secondary | ICD-10-CM | POA: Diagnosis present

## 2018-02-05 DIAGNOSIS — Z6834 Body mass index (BMI) 34.0-34.9, adult: Secondary | ICD-10-CM | POA: Diagnosis not present

## 2018-02-05 DIAGNOSIS — N200 Calculus of kidney: Secondary | ICD-10-CM

## 2018-02-05 DIAGNOSIS — Z79899 Other long term (current) drug therapy: Secondary | ICD-10-CM | POA: Diagnosis not present

## 2018-02-05 DIAGNOSIS — F329 Major depressive disorder, single episode, unspecified: Secondary | ICD-10-CM | POA: Insufficient documentation

## 2018-02-05 HISTORY — PX: HOLMIUM LASER APPLICATION: SHX5852

## 2018-02-05 HISTORY — DX: Pneumonia, unspecified organism: J18.9

## 2018-02-05 HISTORY — PX: CYSTOSCOPY WITH RETROGRADE PYELOGRAM, URETEROSCOPY AND STENT PLACEMENT: SHX5789

## 2018-02-05 LAB — BASIC METABOLIC PANEL
ANION GAP: 12 (ref 5–15)
BUN: 16 mg/dL (ref 6–20)
CALCIUM: 9.4 mg/dL (ref 8.9–10.3)
CO2: 24 mmol/L (ref 22–32)
CREATININE: 1.16 mg/dL (ref 0.61–1.24)
Chloride: 105 mmol/L (ref 98–111)
GFR calc Af Amer: >60 mL/min (ref >60)
GLUCOSE: 83 mg/dL (ref 70–99)
Potassium: 3.9 mmol/L (ref 3.5–5.1)
Sodium: 141 mmol/L (ref 135–145)

## 2018-02-05 LAB — CBC
HEMATOCRIT: 46.5 % (ref 39.0–52.0)
Hemoglobin: 15.8 g/dL (ref 13.0–17.0)
MCH: 31.1 pg (ref 26.0–34.0)
MCHC: 34 g/dL (ref 30.0–36.0)
MCV: 91.5 fL (ref 78.0–100.0)
PLATELETS: 436 10*3/uL — AB (ref 150–400)
RBC: 5.08 MIL/uL (ref 4.22–5.81)
RDW: 13.2 % (ref 11.5–15.5)
WBC: 17.6 10*3/uL — ABNORMAL HIGH (ref 4.0–10.5)

## 2018-02-05 SURGERY — CYSTOURETEROSCOPY, WITH RETROGRADE PYELOGRAM AND STENT INSERTION
Anesthesia: General | Site: Ureter | Laterality: Right

## 2018-02-05 SURGERY — Surgical Case
Anesthesia: *Unknown

## 2018-02-05 MED ORDER — MEPERIDINE HCL 50 MG/ML IJ SOLN: 6.2500 mg | INTRAMUSCULAR | Status: DC | PRN

## 2018-02-05 MED ORDER — DEXAMETHASONE SODIUM PHOSPHATE 10 MG/ML IJ SOLN
INTRAMUSCULAR | Status: DC | PRN
  Administered 2018-02-05: 10 mg via INTRAVENOUS

## 2018-02-05 MED ORDER — PROPOFOL 10 MG/ML IV BOLUS
INTRAVENOUS | Status: AC
  Filled 2018-02-05: qty 20

## 2018-02-05 MED ORDER — CIPROFLOXACIN IN D5W 400 MG/200ML IV SOLN
400.0000 mg | Freq: Once | INTRAVENOUS | Status: AC
  Administered 2018-02-05: 400 mg via INTRAVENOUS

## 2018-02-05 MED ORDER — PROMETHAZINE HCL 25 MG/ML IJ SOLN: 6.2500 mg | INTRAMUSCULAR | Status: DC | PRN

## 2018-02-05 MED ORDER — HYDROCODONE-ACETAMINOPHEN 7.5-325 MG PO TABS: 1.0000 | ORAL_TABLET | Freq: Once | ORAL | Status: DC | PRN

## 2018-02-05 MED ORDER — CIPROFLOXACIN IN D5W 400 MG/200ML IV SOLN
INTRAVENOUS | Status: AC
  Filled 2018-02-05: qty 200

## 2018-02-05 MED ORDER — ACETAMINOPHEN 10 MG/ML IV SOLN: 1000.0000 mg | Freq: Once | INTRAVENOUS | Status: DC | PRN

## 2018-02-05 MED ORDER — BELLADONNA ALKALOIDS-OPIUM 16.2-30 MG RE SUPP
RECTAL | Status: AC
  Filled 2018-02-05: qty 1

## 2018-02-05 MED ORDER — LIDOCAINE 2% (20 MG/ML) 5 ML SYRINGE
INTRAMUSCULAR | Status: DC | PRN
  Administered 2018-02-05: 100 mg via INTRAVENOUS

## 2018-02-05 MED ORDER — SODIUM CHLORIDE 0.9 % IV SOLN
INTRAVENOUS | Status: DC | PRN
  Administered 2018-02-05: 9 mL

## 2018-02-05 MED ORDER — SODIUM CHLORIDE 0.9 % IR SOLN
Status: DC | PRN
  Administered 2018-02-05: 5000 mL

## 2018-02-05 MED ORDER — MIDAZOLAM HCL 2 MG/2ML IJ SOLN
INTRAMUSCULAR | Status: AC
  Filled 2018-02-05: qty 2

## 2018-02-05 MED ORDER — HYDROMORPHONE HCL 1 MG/ML IJ SOLN: 0.2500 mg | INTRAMUSCULAR | Status: DC | PRN

## 2018-02-05 MED ORDER — ONDANSETRON HCL 4 MG/2ML IJ SOLN
INTRAMUSCULAR | Status: DC | PRN
  Administered 2018-02-05: 4 mg via INTRAVENOUS

## 2018-02-05 MED ORDER — LACTATED RINGERS IV SOLN
INTRAVENOUS | Status: DC
  Administered 2018-02-05 (×2): via INTRAVENOUS

## 2018-02-05 MED ORDER — ONDANSETRON HCL 4 MG/2ML IJ SOLN
INTRAMUSCULAR | Status: AC
  Filled 2018-02-05: qty 2

## 2018-02-05 MED ORDER — PROPOFOL 10 MG/ML IV BOLUS
INTRAVENOUS | Status: DC | PRN
  Administered 2018-02-05: 200 mg via INTRAVENOUS

## 2018-02-05 MED ORDER — FENTANYL CITRATE (PF) 250 MCG/5ML IJ SOLN
INTRAMUSCULAR | Status: AC
  Filled 2018-02-05: qty 5

## 2018-02-05 MED ORDER — BELLADONNA ALKALOIDS-OPIUM 16.2-60 MG RE SUPP
RECTAL | Status: DC | PRN
  Administered 2018-02-05: 1 via RECTAL

## 2018-02-05 MED ORDER — SODIUM CHLORIDE 0.9 % IR SOLN
Status: DC | PRN
  Administered 2018-02-05: 1000 mL

## 2018-02-05 MED ORDER — CIPROFLOXACIN HCL 500 MG PO TABS: 500.0000 mg | ORAL_TABLET | Freq: Once | ORAL | 0 refills | Status: AC

## 2018-02-05 MED ORDER — MIDAZOLAM HCL 5 MG/5ML IJ SOLN
INTRAMUSCULAR | Status: DC | PRN
  Administered 2018-02-05: 2 mg via INTRAVENOUS

## 2018-02-05 MED ORDER — ACETAMINOPHEN 10 MG/ML IV SOLN
INTRAVENOUS | Status: AC
  Filled 2018-02-05: qty 100

## 2018-02-05 MED ORDER — ACETAMINOPHEN 10 MG/ML IV SOLN
INTRAVENOUS | Status: DC | PRN
  Administered 2018-02-05: 1000 mg via INTRAVENOUS

## 2018-02-05 MED ORDER — FENTANYL CITRATE (PF) 100 MCG/2ML IJ SOLN
INTRAMUSCULAR | Status: AC
  Filled 2018-02-05: qty 2

## 2018-02-05 MED ORDER — DEXAMETHASONE SODIUM PHOSPHATE 10 MG/ML IJ SOLN
INTRAMUSCULAR | Status: AC
  Filled 2018-02-05: qty 1

## 2018-02-05 MED ORDER — TRAMADOL HCL 50 MG PO TABS: 50.0000 mg | ORAL_TABLET | Freq: Four times a day (QID) | ORAL | 0 refills | Status: DC | PRN

## 2018-02-05 MED ORDER — FENTANYL CITRATE (PF) 100 MCG/2ML IJ SOLN
INTRAMUSCULAR | Status: DC | PRN
  Administered 2018-02-05 (×5): 50 ug via INTRAVENOUS

## 2018-02-05 MED ORDER — PHENAZOPYRIDINE HCL 200 MG PO TABS: 200.0000 mg | ORAL_TABLET | Freq: Three times a day (TID) | ORAL | 0 refills | Status: DC | PRN

## 2018-02-05 SURGICAL SUPPLY — 29 items
APL SKNCLS STERI-STRIP NONHPOA (GAUZE/BANDAGES/DRESSINGS) ×1
BAG URO CATCHER STRL LF (MISCELLANEOUS) ×3 IMPLANT
BASKET ZERO TIP NITINOL 2.4FR (BASKET) ×2 IMPLANT
BENZOIN TINCTURE PRP APPL 2/3 (GAUZE/BANDAGES/DRESSINGS) ×2 IMPLANT
BSKT STON RTRVL ZERO TP 2.4FR (BASKET) ×1
CATH URET 5FR 28IN OPEN ENDED (CATHETERS) ×3 IMPLANT
CLOTH BEACON ORANGE TIMEOUT ST (SAFETY) ×3 IMPLANT
COVER SURGICAL LIGHT HANDLE (MISCELLANEOUS) ×3 IMPLANT
DRSG TEGADERM 2-3/8X2-3/4 SM (GAUZE/BANDAGES/DRESSINGS) ×2 IMPLANT
EXTRACTOR STONE 1.7FRX115CM (UROLOGICAL SUPPLIES) IMPLANT
FIBER LASER FLEXIVA 1000 (UROLOGICAL SUPPLIES) IMPLANT
FIBER LASER FLEXIVA 365 (UROLOGICAL SUPPLIES) ×2 IMPLANT
FIBER LASER FLEXIVA 550 (UROLOGICAL SUPPLIES) IMPLANT
FIBER LASER TRAC TIP (UROLOGICAL SUPPLIES) IMPLANT
GLOVE BIOGEL M STRL SZ7.5 (GLOVE) ×3 IMPLANT
GLOVE BIOGEL PI IND STRL 7.5 (GLOVE) IMPLANT
GLOVE BIOGEL PI INDICATOR 7.5 (GLOVE) ×6
GOWN STRL REUS W/TWL XL LVL3 (GOWN DISPOSABLE) ×5 IMPLANT
GUIDEWIRE ANG ZIPWIRE 038X150 (WIRE) IMPLANT
GUIDEWIRE STR DUAL SENSOR (WIRE) ×3 IMPLANT
MANIFOLD NEPTUNE II (INSTRUMENTS) ×3 IMPLANT
PACK CYSTO (CUSTOM PROCEDURE TRAY) ×3 IMPLANT
SHEATH URETERAL 12FRX28CM (UROLOGICAL SUPPLIES) IMPLANT
SHEATH URETERAL 12FRX35CM (MISCELLANEOUS) IMPLANT
STENT CONTOUR 6FRX28X.038 (STENTS) ×2 IMPLANT
TUBING CONNECTING 10 (TUBING) ×2 IMPLANT
TUBING CONNECTING 10' (TUBING) ×1
TUBING UROLOGY SET (TUBING) ×3 IMPLANT
WIRE COONS/BENSON .038X145CM (WIRE) IMPLANT

## 2018-02-05 NOTE — H&P (Signed)
I have a history of kidney stones.  HPI: Keith Walton is a 37 year-old male established patient who is here for F/U due to a history of renal calculi.  Patient has a known right distal ureteral calculi with upstream obstructive signs. Given the stones location, medical expulsive therapy was discussed and implemented utilizing tamsulosin and prescribed analgesia as well as antimesenteric therapy at last office visit with Dr. Marlou Porch. Presents today waking up with acute right-sided lower back and groin pain described as colicky in nature. Pain radiates from the groin to the lower back. Associated with nausea as well as vomiting. Patient endorses urinary urgency with weak dribbling stream as well as hesitancy. Denies burning or painful urination. Denies gross hematuria. Denies interval stones material passage. Denies fevers. He presents today with his father.   The patient was last seen 01/08/2018. The patient's stone was on his right side. He did not pass a stone since the last office visit. The patient has been taking Flomax/Ultram/oxycodone for their calculus disease.   The patient has had flank pain since they were last seen. The patient has developed urgency and intermittency. He has no seen blood in his urine since the last visit. He is currently having back pain, groin pain, and nausea. He denies having flank pain, vomiting, fever, and chills. He has never had surgical treatment for calculi in the past.     ALLERGIES: clarithromycin Erythromycin - Hives Ibuprofen - upset stomach Levaquin - Vomiting Penicillins    MEDICATIONS: Citalopram Hbr 20 mg tablet  Flomax  Ibuprofen 800 mg tablet  Trazodone Hcl 100 mg tablet  Ultram 50 mg tablet 1-2 tablet PO Q 6 H  Voltaren  Zofran 4 mg tablet 1 tablet PO Q 4 H  Zofran     GU PSH: None     PSH Notes: Colonoscopy- small int hemorrhoids, esophagogastroduodenoscopy   NON-GU PSH: None   GU PMH: Ureteral calculus - 01/08/2018, (Acute), Right,  Despite the fact that his stone has not progressed significantly he did want to proceed with medical expulsive therapy rather than proceed with any form of surgical intervention initially. I'll have him remain on tamsulosin and give him some stronger pain medication., - 06/08/2016 Renal calculus (Stable), Left, Nonobstructing left renal calculi were noted on his CT scan on 06/04/16 with no right renal calculi identified. - 06/08/2016 Ureteral obstruction secondary to calculous (Acute), Right, Right hydronephrosis was identified on his CT scan associated with his right ureteral S. - 06/08/2016      PMH Notes: IBS   NON-GU PMH: Anxiety disorder due to known physiological condition Depression    FAMILY HISTORY: Aortic aneurysm, descending - Mother Hypercholesterolemia - Mother Hypertension - Mother nephrolithiasis - Sister, Mother No Children - Other schizophrenia - Grandmother Thyroid Disease - Sister   SOCIAL HISTORY: Marital Status: Single Preferred Language: English; Ethnicity: Not Hispanic Or Latino; Race: White Current Smoking Status: Patient has never smoked.  Drinks 2 drinks per day.  Does not use drugs. Drinks 3 caffeinated drinks per day.    REVIEW OF SYSTEMS:    GU Review Male:   Patient denies frequent urination, hard to postpone urination, burning/ pain with urination, get up at night to urinate, leakage of urine, stream starts and stops, trouble starting your stream, have to strain to urinate , erection problems, and penile pain.  Gastrointestinal (Upper):   Patient reports nausea. Patient denies vomiting and indigestion/ heartburn.  Gastrointestinal (Lower):   Patient denies diarrhea and constipation.  Constitutional:  Patient denies fever, night sweats, weight loss, and fatigue.  Skin:   Patient denies skin rash/ lesion and itching.  Eyes:   Patient denies blurred vision and double vision.  Ears/ Nose/ Throat:   Patient denies sore throat and sinus problems.   Hematologic/Lymphatic:   Patient denies swollen glands and easy bruising.  Cardiovascular:   Patient denies leg swelling and chest pains.  Respiratory:   Patient denies shortness of breath and cough.  Endocrine:   Patient denies excessive thirst.  Musculoskeletal:   Patient denies back pain and joint pain.  Neurological:   Patient denies headaches and dizziness.  Psychologic:   Patient denies depression and anxiety.   Notes: pressure, nausea    VITAL SIGNS:      02/01/2018 02:34 PM  BP 138/85 mmHg  Heart Rate 90 /min  Temperature 99.0 F / 37.2 C   MULTI-SYSTEM PHYSICAL EXAMINATION:    Constitutional: Well-nourished. No physical deformities. Normally developed. Good grooming.  Neck: Neck symmetrical, not swollen. Normal tracheal position.  Respiratory: No labored breathing, no use of accessory muscles.   Cardiovascular: Normal temperature, normal extremity pulses, no swelling, no varicosities.  Skin: No paleness, no jaundice, no cyanosis. No lesion, no ulcer, no rash.  Neurologic / Psychiatric: Oriented to time, oriented to place, oriented to person. No depression, no anxiety, no agitation.  Gastrointestinal: Obese abdomen. No mass, no tenderness, no rigidity. Mild right CVAT.  Musculoskeletal: Normal gait and station of head and neck.     PAST DATA REVIEWED:  Source Of History:  Patient, Family/Caregiver  Records Review:   Previous Patient Records  X-Ray Review: [facility] Protocol: Reviewed Films. Reviewed Report. Discussed With Patient.     PROCEDURES:         PVR Ultrasound - [id number]  Scanned Volume: 0 cc         Ketoralac 60mg  - [id number], [id number] Skin was prepped with alcohol. Site was injected. Pt tolerated well.   Qty: 60 Adm. By: Tawnya CrookErica Wheless  Unit: mg Lot No [id number]  Route: IM Exp. Date 07/21/2019  Freq: None Mfgr.:   Site: Left Hip         Morphine 8mg  - [id number], J2270 Qty: 8 Adm. By: Tawnya CrookErica Wheless  Unit: mg Lot No [id number]  Route: IM Exp. Date 12/18/2018  Freq:  None Mfgr.:   Site: Right Hip         Phenergan 25mg  - [id number], J2550 Qty: 25 Adm. By: Tawnya CrookErica Wheless  Unit: mg Lot No [id number]  Route: IM Exp. Date 02/18/2019  Freq: None Mfgr.:   Site: Left Hip        Notes:   pt. states he has NOT voided since 5am today   ASSESSMENT:      ICD-10 Details  1 GU:   Ureteral calculus - N20.1 Right  2   Renal colic - N23 Right   PLAN:            Medications New Meds: Hydromorphone Hcl 2 mg tablet 1 tablet PO Q 6 H PRN   #20  0 Refill(s)  Promethazine Hcl 25 mg tablet 1 tablet PO Q 6 H PRN   #20  1 Refill(s)    Stop Meds: Oxycodone Hcl 20 mg tablet 1 tablet PO Q 4 H  Start: 06/08/2016  Discontinue: 02/01/2018  - Reason: The medication cycle was completed.  Oxycodone Hcl 10 mg tablet 1-2 tablet PO Q 4 H  Start: 12/09/2015  Discontinue: 02/01/2018  - Reason: The medication cycle  was completed.            Orders Labs Urine Culture          Schedule Return Visit/Planned Activity: ASAP - Schedule Surgery             Note: Cancel pt f/u appointment on 08/20  Pro[facility]: [facility]01/2018 [facility] - [phone] - Ketoralac 60mg  (Toradol Per 15 Mg) - [id number], [phone] [id number]  Pro[facility]: [facility]/2018 [facility] - [phone] - Morphine 8mg  (Ther/Proph/Diag Inj, Vista/Im) - [phone], [id number]  Procedure: [facility]01/2018 at [facility] - [phone] - Phenergan 25mg  (Phenergan Per 50 Mg) - J2550, [id number]          Document Letter(s):  Created for Patient: Clinical Summary         Notes:   Pain management was achieved utilizing morphine, Toradol, and promethazine. I discussed the case with the patient's urologist as well as the on-call urologist who both agreed to schedule the patient for ureteroscopy sometime next week. I have reviewed the risks of ureteroscopy with the patient including bleeding, infection, ureteral injury, need for a stent or secondary procedures, thrombotic events and anesthetic complications. Pt agreeable to  plan of care moving forward. He was given an RX for hydromorphone and promethazine. Appropriate instructions about use, SE, and bowel management considerations all discussed in detail. Explicit ED f/u instructions given for over the weekend for fevers, painful inability to void, uncontrollable nausea and vomiting, uncontrollable pain, and progressively worsening lower urinary tract symptoms. His appointment for 20th of August will be canceled. I have given a scheduling sheet for procedure to the surgery scheduler.

## 2018-02-05 NOTE — Anesthesia Procedure Notes (Signed)
Procedure Name: LMA Insertion Date/Time: 02/05/2018 4:42 PM Performed by: Illene SilverEvans, Jermichael Belmares E, CRNA Pre-anesthesia Checklist: Patient identified, Emergency Drugs available, Suction available and Patient being monitored Patient Re-evaluated:Patient Re-evaluated prior to induction Oxygen Delivery Method: Circle system utilized Preoxygenation: Pre-oxygenation with 100% oxygen Induction Type: IV induction Ventilation: Mask ventilation without difficulty LMA: LMA with gastric port inserted LMA Size: 5.0 Tube type: Oral Number of attempts: 1 Airway Equipment and Method: Stylet Placement Confirmation: positive ETCO2 Tube secured with: Tape Dental Injury: Teeth and Oropharynx as per pre-operative assessment

## 2018-02-05 NOTE — Op Note (Signed)
Preoperative diagnosis: right ureteral calculus  Postoperative diagnosis: right ureteral calculus  Procedure:  1. Cystoscopy 2. right ureteroscopy and stone removal 3. Ureteroscopic laser lithotripsy 4. right 46F x 28 ureteral stent placement  5. right retrograde pyelography with interpretation  Surgeon: Crist FatBenjamin W. Somaly Marteney, MD  Anesthesia: General  Complications: None  Intraoperative findings: #1: Right retrograde pyelography demonstrated a filling defect within the right proximal ureter consistent with the patient's known calculus and severe proximal hydro-ureteronephrosis without other abnormalities.   #2: Stone was impacted.  EBL: Minimal  Specimens: 1. right ureteral calculus  Disposition of specimens: [facility] for stone analysis  Indication: Keith Walton is a 37 y.o.   patient with a right ureteral stone and associated right symptoms. After reviewing the management options for treatment, the patient elected to proceed with the above surgical procedure(s). We have discussed the potential benefits and risks of the procedure, side effects of the proposed treatment, the likelihood of the patient achieving the goals of the procedure, and any potential problems that might occur during the procedure or recuperation. Informed consent has been obtained.   Description of procedure:  The patient was taken to the operating room and general anesthesia was induced.  The patient was placed in the dorsal lithotomy position, prepped and draped in the usual sterile fashion, and preoperative antibiotics were administered. A preoperative time-out was performed.   Cystourethroscopy was performed.  The patient's urethra was examined and was normal. The bladder was then systematically examined in its entirety. There was no evidence for any bladder tumors, stones, or other mucosal pathology.    Attention then turned to the right ureteral orifice and a ureteral catheter was  used to intubate the ureteral orifice.  Omnipaque contrast was injected through the ureteral catheter and a retrograde pyelogram was performed with findings as dictated above.  A 0.38 sensor guidewire was then advanced up the right ureter into the renal pelvis under fluoroscopic guidance. The 6 Fr semirigid ureteroscope was then advanced into the ureter next to the guidewire and the calculus was identified.   The stone was then fragmented with the 365 micron holmium laser fiber on a setting of 0.6 and frequency of 6 Hz.   All stones were then removed from the ureter with an N-gage nitinol basket.  Reinspection of the ureter revealed no remaining visible stones or fragments.   The wire was then backloaded through the cystoscope and a ureteral stent was advance over the wire using Seldinger technique.  The stent was positioned appropriately under fluoroscopic and cystoscopic guidance.  The wire was then removed with an adequate stent curl noted in the renal pelvis as well as in the bladder.  The bladder was then emptied and the procedure ended.  The patient appeared to tolerate the procedure well and without complications.  The patient was able to be awakened and transferred to the recovery unit in satisfactory condition.   Disposition: The tether of the stent was left on and secured to the ventral aspect of the patient's penis.  Instructions for removing the stent have been provided to the patient. The patient has been scheduled for followup in 6 weeks with a renal ultrasound.

## 2018-02-05 NOTE — Anesthesia Postprocedure Evaluation (Signed)
Anesthesia Post Note  Patient: Keith Walton  Procedure(s) Performed: CYSTOSCOPY WITH RETROGRADE URETEROSCOPY LASER LITHOTRIPSY AND STENT PLACEMENT (Right Ureter) HOLMIUM LASER APPLICATION (Right Ureter)     Patient location during evaluation: PACU Anesthesia Type: General Level of consciousness: awake and alert Pain management: pain level controlled Vital Signs Assessment: post-procedure vital signs reviewed and stable Respiratory status: spontaneous breathing, nonlabored ventilation, respiratory function stable and patient connected to nasal cannula oxygen Cardiovascular status: blood pressure returned to baseline and stable Postop Assessment: no apparent nausea or vomiting Anesthetic complications: no    Last Vitals:  Vitals:   02/05/18 1815 02/05/18 1843  BP: 139/90 131/89  Pulse: 93 84  Resp: 16 16  Temp: 36.8 C 37 C  SpO2: 97% 97%    Last Pain:  Vitals:   02/05/18 1843  TempSrc:   PainSc: 0-No pain                 Trevor IhaStephen A Houser

## 2018-02-05 NOTE — Transfer of Care (Signed)
Immediate Anesthesia Transfer of Care Note  Patient: Keith Walton  Procedure(s) Performed: CYSTOSCOPY WITH RETROGRADE URETEROSCOPY LASER LITHOTRIPSY AND STENT PLACEMENT (Right Ureter) HOLMIUM LASER APPLICATION (Right Ureter)  Patient Location: PACU  Anesthesia Type:General  Level of Consciousness: sedated and patient cooperative  Airway & Oxygen Therapy: Patient Spontanous Breathing and Patient connected to face mask oxygen  Post-op Assessment: Report given to RN and Post -op Vital signs reviewed and stable  Post vital signs: stable  Last Vitals:  Vitals Value Taken Time  BP 129/80 02/05/2018  5:53 PM  Temp 36.9 C 02/05/2018  5:53 PM  Pulse 93 02/05/2018  5:57 PM  Resp 12 02/05/2018  5:57 PM  SpO2 100 % 02/05/2018  5:57 PM  Vitals shown include unvalidated device data.  Last Pain:  Vitals:   02/05/18 1753  TempSrc:   PainSc: Asleep         Complications: No apparent anesthesia complications

## 2018-02-05 NOTE — Discharge Instructions (Signed)
DISCHARGE INSTRUCTIONS FOR KIDNEY STONE/URETERAL STENT   MEDICATIONS:  1.  Resume all your other meds from home - except do not take any extra narcotic pain meds that you may have at home.  2. Pyridium is to help with the burning/stinging when you urinate. 3. Tramadol is for moderate/severe pain, otherwise taking upto 1000 mg every 6 hours of plainTylenol will help treat your pain.   4. Take Cipro one hour prior to removal of your stent.   ACTIVITY:  1. No strenuous activity x 1week  2. No driving while on narcotic pain medications  3. Drink plenty of water  4. Continue to walk at home - you can still get blood clots when you are at home, so keep active, but don't over do it.  5. May return to work/school tomorrow or when you feel ready   BATHING:  1. You can shower and we recommend daily showers  2. You have a string coming from your urethra: The stent string is attached to your ureteral stent. Do not pull on this.   SIGNS/SYMPTOMS TO CALL:  Please call us if you have a fever greater than 101.5, uncontrolled nausea/vomiting, uncontrolled pain, dizziness, unable to urinate, bloody urine, chest pain, shortness of breath, leg swelling, leg pain, redness around wound, drainage from wound, or any other concerns or questions.   You can reach us at [phone].   FOLLOW-UP:  1. You have an appointment in 6 weeks with a ultrasound of your kidneys prior.  2. You have a string attached to your stent, you may remove it on August 26th. To do this, pull the strings until the stents are completely removed. You may feel an odd sensation in your back.

## 2018-02-06 ENCOUNTER — Encounter (HOSPITAL_COMMUNITY): Payer: Self-pay | Admitting: Urology

## 2019-02-22 IMAGING — CT CT RENAL STONE PROTOCOL
2 of 4 series · 17 of 46 positions shown, 19 images · non-contrast
Comparison: CT abdomen and pelvis June 04, 2016

CLINICAL DATA: RIGHT flank and groin pain today. History of kidney
stones.

EXAM:
CT ABDOMEN AND PELVIS WITHOUT CONTRAST
TECHNIQUE: Multidetector CT imaging of the abdomen and pelvis was performed
following the standard protocol without IV contrast.

[Series 2: axial st · axial · 0.87mm/px · z∈[+633,+1103]mm · 14 of 104 slices shown, 16 images]
[im 5/104  soft-tissue]
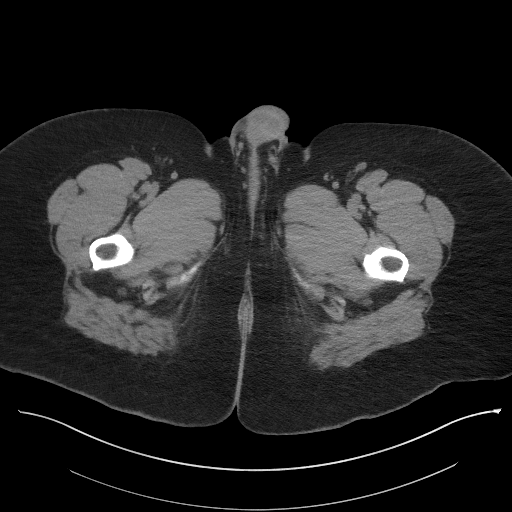
[im 5/104  bone]
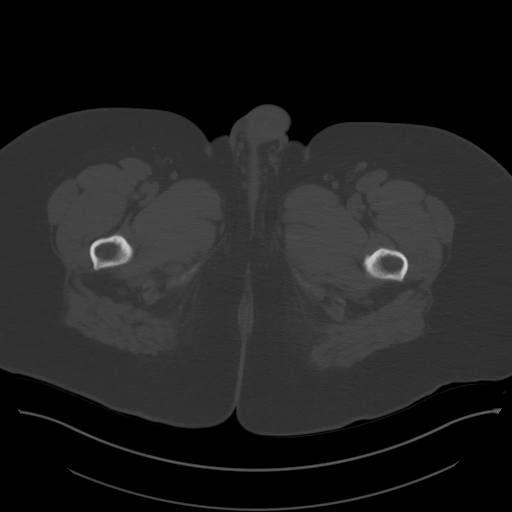
[im 13/104  soft-tissue]
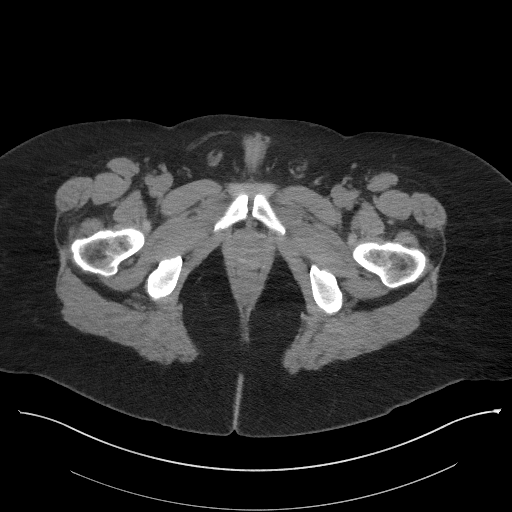
[im 21/104  soft-tissue]
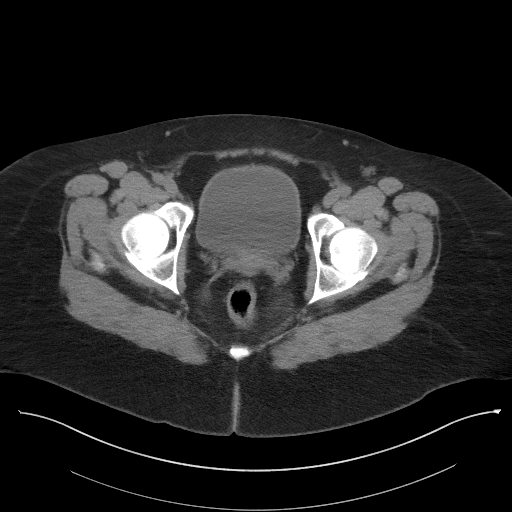
[im 29/104  soft-tissue]
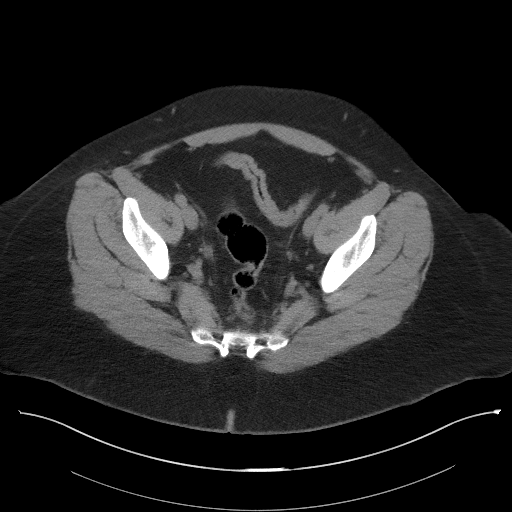
[im 33/104  soft-tissue]
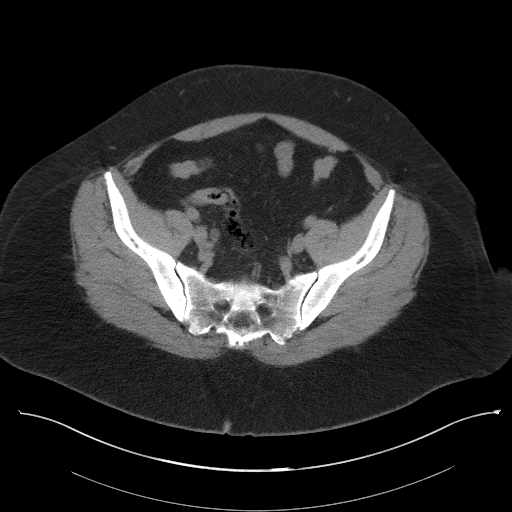
[im 42/104  soft-tissue]
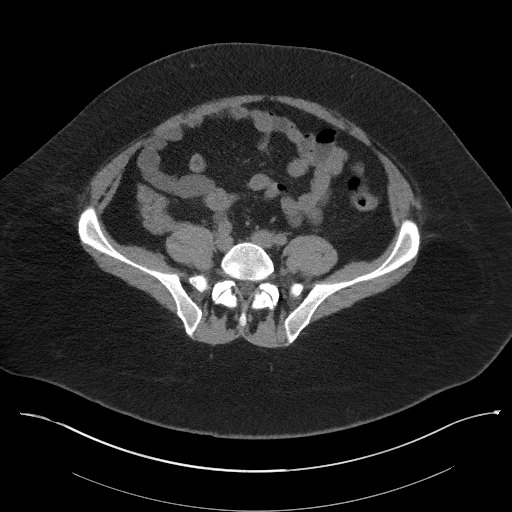
[im 50/104  soft-tissue]
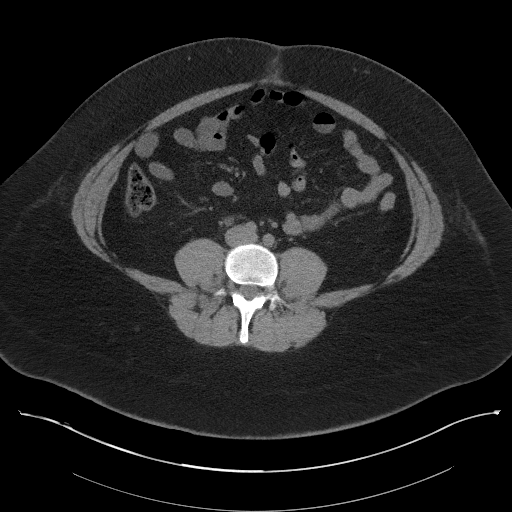
[im 54/104  soft-tissue]
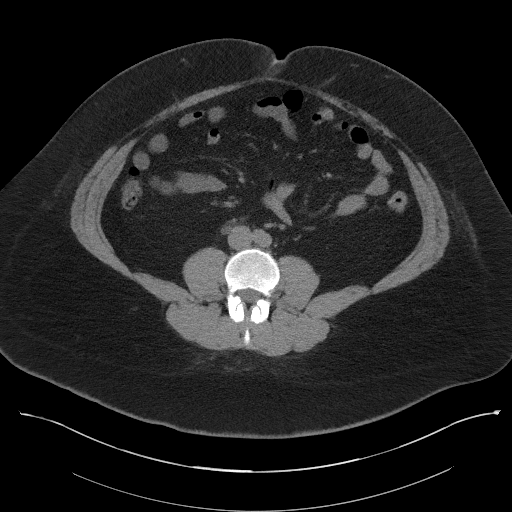
[im 62/104  soft-tissue]
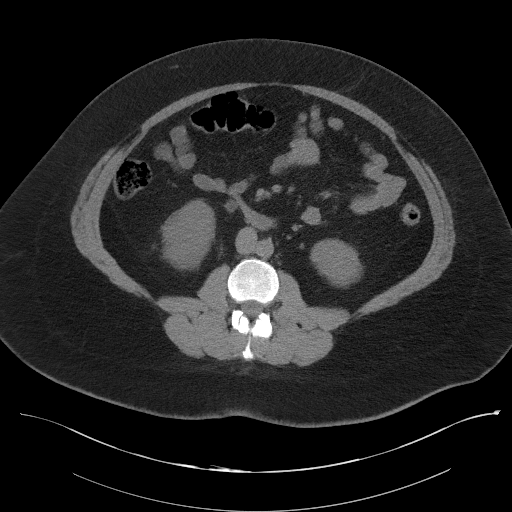
[im 62/104  bone]
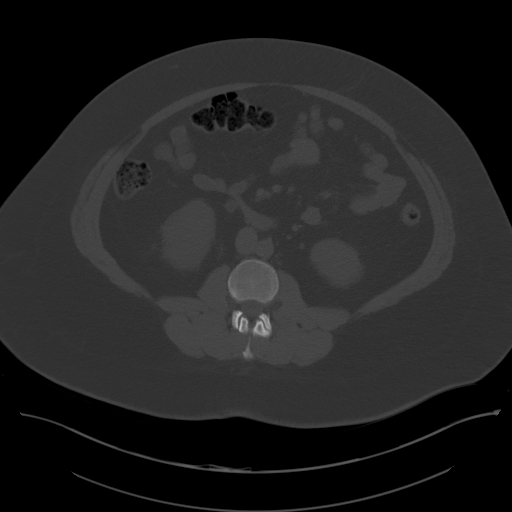
[im 71/104  soft-tissue]
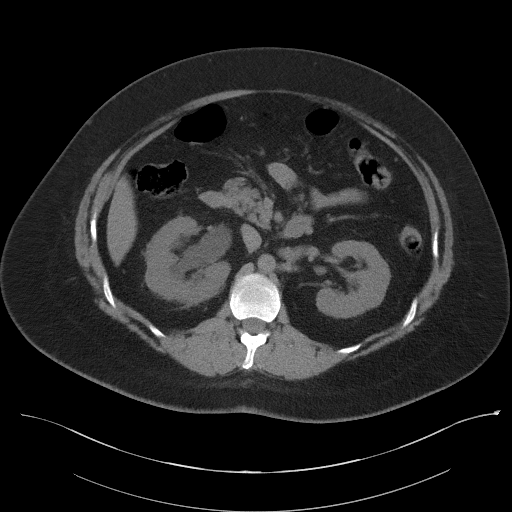
[im 79/104  soft-tissue]
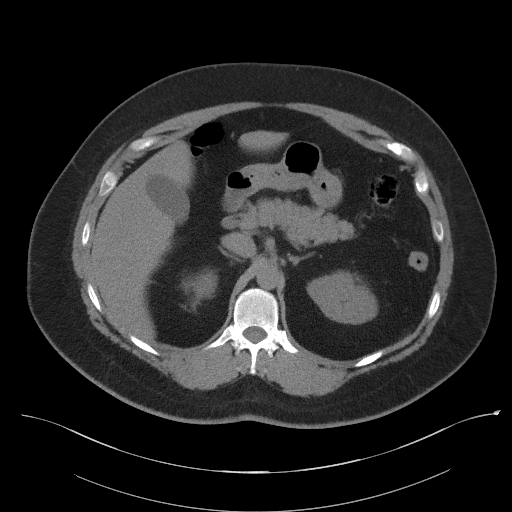
[im 83/104  soft-tissue]
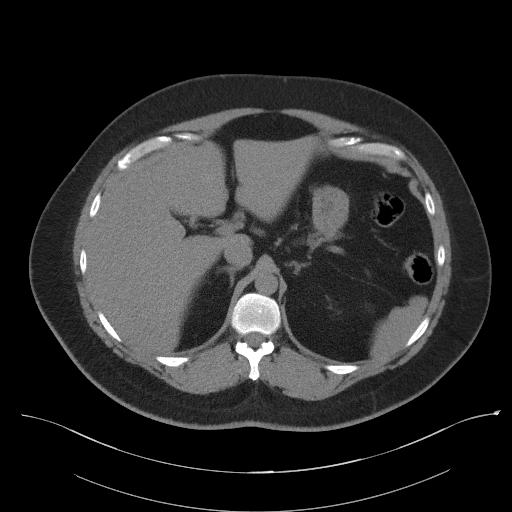
[im 91/104  soft-tissue]
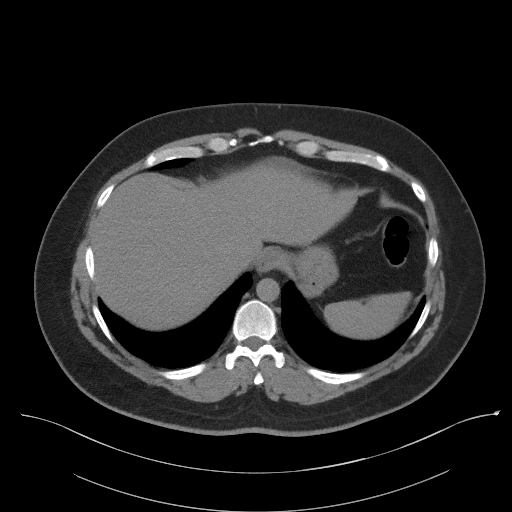
[im 99/104  soft-tissue]
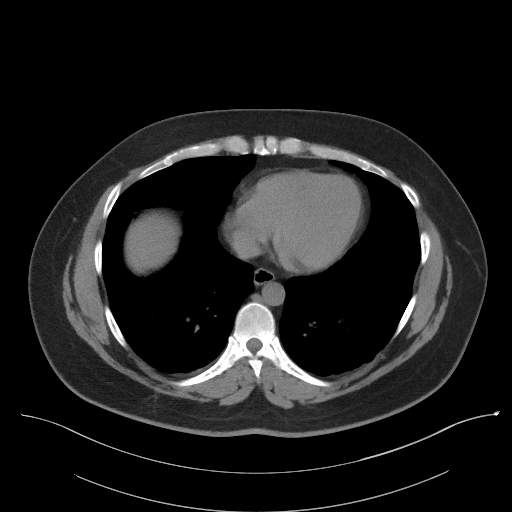

[Series 5: coronal st · coronal · 0.97mm/px · 3 of 108 slices shown]
[im 36/108  soft-tissue]
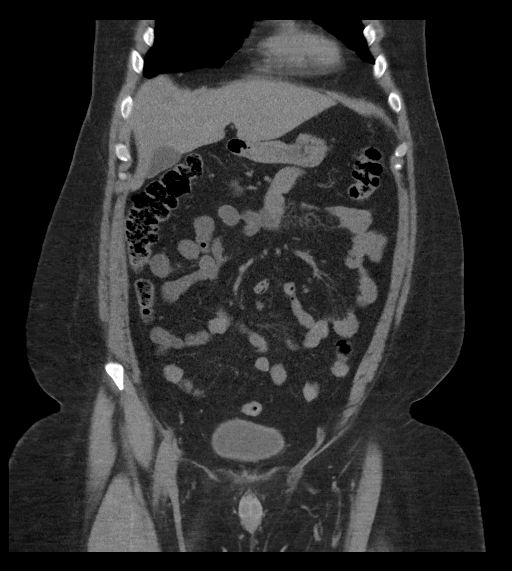
[im 48/108  soft-tissue]
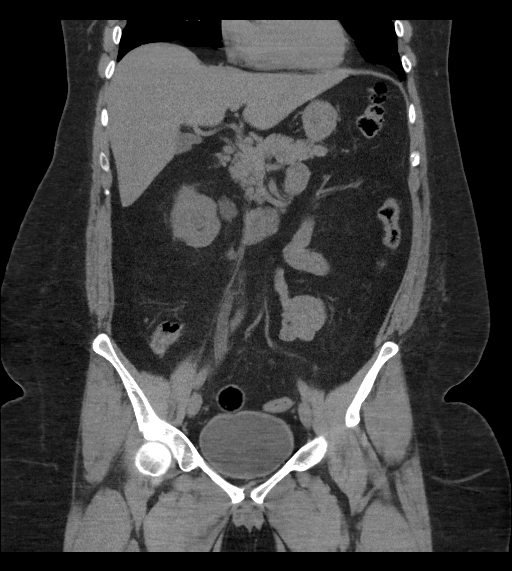
[im 60/108  soft-tissue]
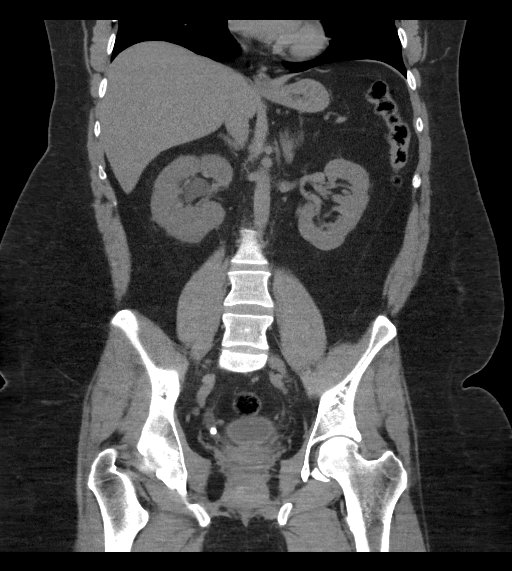

[17 of 46 positions shown; findings below may reference images not displayed]

FINDINGS: LOWER CHEST: Lung bases are clear. The visualized heart size is
normal. No pericardial effusion.

HEPATOBILIARY: Normal.

PANCREAS: Normal.

SPLEEN: Normal.

ADRENALS/URINARY TRACT: Kidneys are orthotopic, demonstrating normal
size and morphology. Moderate RIGHT hydroureteronephrosis to the
level of the distal ureter where a 6 mm calculus is present.
Punctate LEFT lower pole, 5 mm LEFT lower pole nephrolithiasis
without hydronephrosis. Limited assessment for renal masses by
nonenhanced CT. Urinary bladder is partially distended and
unremarkable. Normal adrenal glands.

STOMACH/BOWEL: The stomach, small and large bowel are normal in
course and caliber without inflammatory changes, sensitivity
decreased by lack of enteric contrast. Normal appendix.

VASCULAR/LYMPHATIC: Aortoiliac vessels are normal in course and
caliber. No lymphadenopathy by CT size criteria.

REPRODUCTIVE: Normal.

OTHER: No intraperitoneal free fluid or free air.

MUSCULOSKELETAL: Non-acute. Small wide necked fat containing
umbilical hernia.
IMPRESSION: 1. 6 mm distal RIGHT ureteral calculus resulting in moderate
obstructive uropathy.
2. Nonobstructing LEFT nephrolithiasis measuring to 5 mm.

## 2023-04-30 DIAGNOSIS — B372 Candidiasis of skin and nail: Secondary | ICD-10-CM | POA: Insufficient documentation

## 2023-04-30 DIAGNOSIS — Z87442 Personal history of urinary calculi: Secondary | ICD-10-CM | POA: Insufficient documentation

## 2023-04-30 HISTORY — DX: Candidiasis of skin and nail: B37.2

## 2023-08-27 ENCOUNTER — Ambulatory Visit (INDEPENDENT_AMBULATORY_CARE_PROVIDER_SITE_OTHER): Payer: 59 | Admitting: Family Medicine

## 2023-08-27 ENCOUNTER — Encounter: Payer: Self-pay | Admitting: Family Medicine

## 2023-08-27 VITALS — BP 136/84 | HR 93 | Temp 98.2°F | Ht 72.0 in | Wt 308.0 lb

## 2023-08-27 DIAGNOSIS — L308 Other specified dermatitis: Secondary | ICD-10-CM

## 2023-08-27 DIAGNOSIS — F410 Panic disorder [episodic paroxysmal anxiety] without agoraphobia: Secondary | ICD-10-CM | POA: Insufficient documentation

## 2023-08-27 DIAGNOSIS — Z9189 Other specified personal risk factors, not elsewhere classified: Secondary | ICD-10-CM | POA: Insufficient documentation

## 2023-08-27 DIAGNOSIS — K429 Umbilical hernia without obstruction or gangrene: Secondary | ICD-10-CM | POA: Diagnosis not present

## 2023-08-27 DIAGNOSIS — L720 Epidermal cyst: Secondary | ICD-10-CM

## 2023-08-27 DIAGNOSIS — Z23 Encounter for immunization: Secondary | ICD-10-CM | POA: Diagnosis not present

## 2023-08-27 DIAGNOSIS — L309 Dermatitis, unspecified: Secondary | ICD-10-CM | POA: Insufficient documentation

## 2023-08-27 DIAGNOSIS — K582 Mixed irritable bowel syndrome: Secondary | ICD-10-CM | POA: Diagnosis not present

## 2023-08-27 DIAGNOSIS — R21 Rash and other nonspecific skin eruption: Secondary | ICD-10-CM | POA: Diagnosis not present

## 2023-08-27 DIAGNOSIS — Z0000 Encounter for general adult medical examination without abnormal findings: Secondary | ICD-10-CM

## 2023-08-27 MED ORDER — FLUOCINONIDE EMULSIFIED BASE 0.05 % EX CREA: 1.0000 | TOPICAL_CREAM | Freq: Two times a day (BID) | CUTANEOUS | 5 refills | Status: AC

## 2023-08-27 MED ORDER — HYOSCYAMINE SULFATE 0.125 MG SL SUBL: 0.1250 mg | SUBLINGUAL_TABLET | Freq: Four times a day (QID) | SUBLINGUAL | 3 refills | Status: AC | PRN

## 2023-08-27 NOTE — Patient Instructions (Signed)
 Return in about 4 weeks (around 09/24/2023) for cpe (20 min), Routine chronic condition follow-up.        Great to see you today.  I have refilled the medication(s) we provide.   If labs were collected or images ordered, we will inform you of  results once we have received them and reviewed. We will contact you either by echart message, or telephone call.  Please give ample time to the testing facility, and our office to run,  receive and review results. Please do not call inquiring of results, even if you can see them in your chart. We will contact you as soon as we are able. If it has been over 1 week since the test was completed, and you have not yet heard from [place], then please call us.    - echart message- for normal results that have been seen by the patient already.   - telephone call: abnormal results or if patient has not viewed results in their echart.  If a referral to a specialist was entered for you, please call us in 2 weeks if you have not heard from the specialist office to schedule.

## 2023-08-27 NOTE — Progress Notes (Signed)
 Patient ID: DORR PERROT, male  DOB: [birth year], 43 y.o.   MRN: [id number] Patient Care Team    Relationship Specialty Notifications Start End  Natalia Leatherwood, DO PCP - General Family Medicine  08/27/23   Crist Fat, MD Attending Physician Urology  08/27/23     Chief Complaint  Patient presents with   Establish Care    Pt has not had PCP in 10 + years.  Pt mentions rash behind R ear for the past few months; irritation and dry skin. Pt has applied OTC eczema cream.     Subjective:  JAMINE HIGHFILL is a 43 y.o.  male present for new patient establishment. All past medical history, surgical history, allergies, family history, immunizations, medications and social history were updated in the electronic medical record today. All recent labs, ED visits and hospitalizations within the last year were reviewed.  Rash: pt reports rash behind his right ear for a few months. He has used OTC eczema cream.  He has a history of eczema that usually pops up on a few patches on his arms on occasion.  He uses over-the-counter eczema cream seems to help.  He has been using this on the back of his ear and it helps some, but has not resolved issue.  IBS: Patient reports longstanding history of irritable bowel syndrome.  He had had a colonoscopy in 2013 which was normal.  That was completed in [facility]-report is scanned.  Recommendations were for routine preventative colonoscopy moving forward. He states he has tried Bentyl in the past and it was not very helpful for him.  He has not been on any daily medications to help with his IBS.  Patient reports he has had an umbilical hernia for quite a few years, and he noticed over the last year it may have gotten a little larger, but overall has remained stable.  He denies any pain, redness or bowel obstruction associated with hernia.  Patient reports a lump in his left armpit that has been present for couple months.   No erythema or pain.  He would like to have this looked at today.  Susy Frizzle reports he has a male life partner that was diagnosed with HIV in 2020.  Had been tested on a few occasions thereafter and had always tested negative.  He reports they are no longer sexually active.  But he is his caretaker.  He would like to make sure he has HIV testing yearly with his CPE.  Patient also would like to discuss weight loss counseling and considering GLP 1 in the future.  He reports he has started to lose some weight on his own approximately 40 pounds.      08/27/2023    9:25 AM  Depression screen PHQ 2/9  Decreased Interest 1  Down, Depressed, Hopeless 2  PHQ - 2 Score 3  Altered sleeping 0  Tired, decreased energy 1  Change in appetite 1  Feeling bad or failure about yourself  0  Trouble concentrating 0  Moving slowly or fidgety/restless 0  Suicidal thoughts 0  PHQ-9 Score 5  Difficult doing work/chores Somewhat difficult      08/27/2023    9:25 AM  GAD 7 : Generalized Anxiety Score  Nervous, Anxious, on Edge 2  Control/stop worrying 2  Worry too much - different things 2  Trouble relaxing 2  Restless 1  Easily annoyed or irritable 0  Afraid - awful might happen 2  Total GAD 7 Score 11  Anxiety Difficulty Somewhat difficult             08/27/2023    9:25 AM  Fall Risk   Falls in the past year? 0  Follow up Falls evaluation completed     Immunization History  Administered Date(s) Administered   Influenza, Seasonal, Injecte, Preservative Fre 08/27/2023   Tdap 08/27/2023    No results found.  Past Medical History:  Diagnosis Date   Adjustment disorder with mixed anxiety and depressed mood 01/02/2014   Allergic rhinitis 03/05/2007   Qualifier: Diagnosis of   By: Claiborne Billings CMA, Jacqualynn      IMO SNOMED Dx Update Oct 2024     Anxiety and depression    Anxiety state 03/05/2007   Qualifier: Diagnosis of   By: Claiborne Billings CMA, Jacqualynn      IMO SNOMED Dx Update Oct 2024      Candidal paronychia 04/30/2023   GAD (generalized anxiety disorder) 06/25/2017   Headache 03/05/2007   Qualifier: Diagnosis of   By: Claiborne Billings CMA, Terance Ice      IMO SNOMED Dx Update Oct 2024     Hemorrhoids    int and ext: hx of BRBPR   IBS (irritable bowel syndrome)    alt constip/diarrhea ([facility])   Kidney stone    Migraine syndrome    Pneumonia    hx    of    Allergies  Allergen Reactions   Clarithromycin Nausea And Vomiting    Stomach cramps    Erythromycin Nausea And Vomiting    Stomach cramps    Ibuprofen     Stomach cramps    Levofloxacin Nausea And Vomiting    Stomach cramps    Penicillins Nausea And Vomiting    Has patient had a PCN reaction causing immediate rash, facial/tongue/throat swelling, SOB or lightheadedness with hypotension: No Has patient had a PCN reaction causing severe rash involving mucus membranes or skin necrosis: No Has patient had a PCN reaction that required hospitalization: Unknown Has patient had a PCN reaction occurring within the last 10 years: No If all of the above answers are "NO", then may proceed with Cephalosporin use.    Past Surgical History:  Procedure Laterality Date   COLONOSCOPY  03/28/2012   "Small int hemorrhoids".  Otherwise normal.   CYSTOSCOPY WITH RETROGRADE PYELOGRAM, URETEROSCOPY AND STENT PLACEMENT Right 02/05/2018   Procedure: CYSTOSCOPY WITH RETROGRADE URETEROSCOPY LASER LITHOTRIPSY AND STENT PLACEMENT;  Surgeon: Crist Fat, MD;  Location: [facility];  Service: Urology;  Laterality: Right;   ESOPHAGOGASTRODUODENOSCOPY  03/28/2012   Duodenal bx: normal small bowel mucosa.  Gastric bx: minimal superficial chronic inflammation--H pylori neg.   HOLMIUM LASER APPLICATION Right 02/05/2018   Procedure: HOLMIUM LASER APPLICATION;  Surgeon: Crist Fat, MD;  Location: [facility];  Service: Urology;  Laterality: Right;   Family History  Problem Relation Age of Onset   Arthritis Mother     Depression Mother    Hyperlipidemia Mother    Hypertension Mother    Arthritis Father    Depression Father    Thyroid disease Sister    Ovarian cancer Sister    Arthritis Sister    Mental illness Brother    Depression Paternal Grandmother    Schizophrenia Paternal Grandmother    Social History   Social History Narrative   Marital status/children/pets: Single. No children.   Education/employment: Masters degree in Nutritional therapist from [employer]. Employed as Architect.    Safety:      -  smoke alarm in the home:Yes     - wears seatbelt: Yes     - Feels safe in their relationships: Yes       Allergies as of 08/27/2023       Reactions   Clarithromycin Nausea And Vomiting   Stomach cramps    Erythromycin Nausea And Vomiting   Stomach cramps    Ibuprofen    Stomach cramps    Levofloxacin Nausea And Vomiting   Stomach cramps    Penicillins Nausea And Vomiting   Has patient had a PCN reaction causing immediate rash, facial/tongue/throat swelling, SOB or lightheadedness with hypotension: No Has patient had a PCN reaction causing severe rash involving mucus membranes or skin necrosis: No Has patient had a PCN reaction that required hospitalization: Unknown Has patient had a PCN reaction occurring within the last 10 years: No If all of the above answers are "NO", then may proceed with Cephalosporin use.        Medication List        Accurate as of August 27, 2023 10:15 AM. If you have any questions, ask your nurse or doctor.          STOP taking these medications    aspirin-acetaminophen-caffeine 250-250-65 MG tablet Commonly known as: EXCEDRIN MIGRAINE Stopped by: Felix Pacini   citalopram 20 MG tablet Commonly known as: CELEXA Stopped by: Felix Pacini   Diclofenac Sodium CR 100 MG 24 hr tablet Commonly known as: Voltaren-XR Stopped by: Felix Pacini   ibuprofen 800 MG tablet Commonly known as: ADVIL Stopped by: Felix Pacini   mometasone 50 MCG/ACT nasal  spray Commonly known as: Nasonex Stopped by: Felix Pacini   phenazopyridine 200 MG tablet Commonly known as: Pyridium Stopped by: Kristen Cardinal EX Stopped by: Felix Pacini   traMADol 50 MG tablet Commonly known as: Ultram Stopped by: Felix Pacini   traZODone 100 MG tablet Commonly known as: DESYREL Stopped by: Felix Pacini       TAKE these medications    fluocinonide-emollient 0.05 % cream Commonly known as: LIDEX-E Apply 1 Application topically 2 (two) times daily. As needed for eczema Started by: Felix Pacini   hyoscyamine 0.125 MG SL tablet Commonly known as: Levsin/SL Place 1 tablet (0.125 mg total) under the tongue every 6 (six) hours as needed. Started by: Felix Pacini        All past medical history, surgical history, allergies, family history, immunizations andmedications were updated in the EMR today and reviewed under the history and medication portions of their EMR.    No results found for this or any previous visit (from the past 2160 hours).  DG C-Arm 1-60 Min-No Report Result Date: 02/05/2018 Fluoroscopy was utilized by the requesting physician.  No radiographic interpretation.     ROS 14 pt review of systems performed and negative (unless mentioned in an HPI)  Objective:  BP 136/84   Pulse 93   Temp 98.2 F (36.8 C)   Ht 6' (1.829 m)   Wt (!) 308 lb (139.7 kg)   SpO2 96%   BMI 41.77 kg/m   Physical Exam Vitals and nursing note reviewed.  Constitutional:      General: He is not in acute distress.    Appearance: Normal appearance. He is obese. He is not ill-appearing, toxic-appearing or diaphoretic.  HENT:     Head: Normocephalic and atraumatic.     Comments: Right ear: Posterior flaking red rash posterior ear and along area of glasses.  Right Ear: Tympanic membrane and ear canal normal. There is no impacted cerumen.     Left Ear: Tympanic membrane and ear canal normal. There is no impacted cerumen.  Eyes:     General: No  scleral icterus.       Right eye: No discharge.        Left eye: No discharge.     Extraocular Movements: Extraocular movements intact.     Pupils: Pupils are equal, round, and reactive to light.  Cardiovascular:     Rate and Rhythm: Normal rate and regular rhythm.  Pulmonary:     Effort: Pulmonary effort is normal. No respiratory distress.     Breath sounds: Normal breath sounds. No wheezing, rhonchi or rales.  Abdominal:     General: Bowel sounds are normal.     Palpations: Abdomen is soft.     Tenderness: There is no abdominal tenderness. There is no guarding or rebound.     Hernia: A hernia (moderate- lg umbilical hernia-partially reducible.) is present.  Musculoskeletal:     Right lower leg: No edema.     Left lower leg: No edema.  Skin:    General: Skin is warm.     Findings: Lesion (epidermal cyst left axilla) and rash present.  Neurological:     Mental Status: He is alert and oriented to person, place, and time. Mental status is at baseline.  Psychiatric:        Mood and Affect: Mood normal.        Behavior: Behavior normal.        Thought Content: Thought content normal.        Judgment: Judgment normal.      Assessment/plan: KAROL LIENDO is a 43 y.o. male present for establish care Encounter for medical examination to establish care Need for diphtheria-tetanus-pertussis (Tdap) vaccine - Tdap vaccine greater than or equal to 7yo IM Influenza vaccine needed - Flu vaccine trivalent PF, 6mos and older(Flulaval,Afluria,Fluarix,Fluzone) Rash (Primary)/eczema He has history of eczema.  He reports use of eczema cream and special shampoo for dry flaky scalp can improve the symptoms behind his right ear. Elected to treat has eczema for now, Lidex emollient twice daily  Umbilical hernia without obstruction and without gangrene Significant size umbilical hernia that I could partially reduce on exam today.  Patient denies any bowel obstruction, we discussed referral to  general surgery and she is agreeable to this today. - Ambulatory referral to General Surgery  Irritable bowel syndrome with constipation and diarrhea Patient reports longstanding history with IBS-D.  He had been tried on Bentyl in the past and it was not helpful. We discussed Levsin and Elavil today. Trial of Levsin prescribed  Epidermal cyst-left axilla Area of concern underneath his left axilla seems to be a small epidermal cyst, not infected at this time.  Recommend monitoring for now.   Return in about 4 weeks (around 09/24/2023) for cpe (20 min), Routine chronic condition follow-up.  Orders Placed This Encounter  Procedures   Flu vaccine trivalent PF, 6mos and older(Flulaval,Afluria,Fluarix,Fluzone)   Tdap vaccine greater than or equal to 7yo IM   Ambulatory referral to General Surgery   Meds ordered this encounter  Medications   fluocinonide-emollient (LIDEX-E) 0.05 % cream    Sig: Apply 1 Application topically 2 (two) times daily. As needed for eczema    Dispense:  60 g    Refill:  5   hyoscyamine (LEVSIN/SL) 0.125 MG SL tablet    Sig: Place 1 tablet (  0.125 mg total) under the tongue every 6 (six) hours as needed.    Dispense:  60 tablet    Refill:  3   Referral Orders         Ambulatory referral to General Surgery       Note is dictated utilizing voice recognition software. Although note has been proof read prior to signing, occasional typographical errors still can be missed. If any questions arise, please do not hesitate to call for verification.  Electronically signed by: Felix Pacini, DO [facility] [place]

## 2023-09-25 ENCOUNTER — Encounter: Admitting: Family Medicine

## 2023-10-15 ENCOUNTER — Encounter: Admitting: Family Medicine

## 2023-10-31 ENCOUNTER — Encounter: Admitting: Family Medicine

## 2023-11-14 DIAGNOSIS — R079 Chest pain, unspecified: Secondary | ICD-10-CM | POA: Insufficient documentation

## 2023-11-14 DIAGNOSIS — I10 Essential (primary) hypertension: Secondary | ICD-10-CM | POA: Insufficient documentation

## 2023-12-20 ENCOUNTER — Encounter: Admitting: Family Medicine

## 2024-02-06 ENCOUNTER — Encounter: Admitting: Family Medicine

## 2024-02-26 ENCOUNTER — Encounter: Admitting: Family Medicine

## 2024-03-21 ENCOUNTER — Encounter: Admitting: Family Medicine

## 2024-05-09 ENCOUNTER — Encounter: Payer: Self-pay | Admitting: Family Medicine

## 2024-05-09 ENCOUNTER — Ambulatory Visit (INDEPENDENT_AMBULATORY_CARE_PROVIDER_SITE_OTHER): Admitting: Family Medicine

## 2024-05-09 DIAGNOSIS — Z23 Encounter for immunization: Secondary | ICD-10-CM

## 2024-05-09 DIAGNOSIS — Z13220 Encounter for screening for lipoid disorders: Secondary | ICD-10-CM

## 2024-05-09 DIAGNOSIS — Z1159 Encounter for screening for other viral diseases: Secondary | ICD-10-CM

## 2024-05-09 DIAGNOSIS — Z131 Encounter for screening for diabetes mellitus: Secondary | ICD-10-CM

## 2024-05-09 DIAGNOSIS — Z114 Encounter for screening for human immunodeficiency virus [HIV]: Secondary | ICD-10-CM

## 2024-05-09 DIAGNOSIS — Z0000 Encounter for general adult medical examination without abnormal findings: Secondary | ICD-10-CM

## 2024-05-09 DIAGNOSIS — Z91199 Patient's noncompliance with other medical treatment and regimen due to unspecified reason: Secondary | ICD-10-CM

## 2024-05-09 NOTE — Patient Instructions (Addendum)

## 2024-05-09 NOTE — Progress Notes (Signed)
 No show CPE

## 2024-07-04 ENCOUNTER — Telehealth: Payer: Self-pay

## 2024-07-04 NOTE — Telephone Encounter (Signed)
 MyChart message sent regarding flu vaccine .

## 2024-07-16 ENCOUNTER — Encounter: Admitting: Family Medicine

## 2024-08-29 ENCOUNTER — Encounter: Admitting: Family Medicine
